# Patient Record
Sex: Female | Born: 1965 | Race: White | Hispanic: No | Marital: Single | Smoking: Current some day smoker
Health system: Southern US, Community
[De-identification: ages and names within clinical notes are randomized; demographics above are authoritative.]

## PROBLEM LIST (undated history)

## (undated) DIAGNOSIS — K219 Gastro-esophageal reflux disease without esophagitis: Secondary | ICD-10-CM

## (undated) HISTORY — PX: TUBAL LIGATION: SHX77

## (undated) HISTORY — DX: Gastro-esophageal reflux disease without esophagitis: K21.9

---

## 2000-06-28 ENCOUNTER — Emergency Department (HOSPITAL_COMMUNITY): Admission: EM | Admit: 2000-06-28 | Discharge: 2000-06-29 | Payer: Self-pay | Admitting: Internal Medicine

## 2002-04-21 ENCOUNTER — Emergency Department (HOSPITAL_COMMUNITY): Admission: EM | Admit: 2002-04-21 | Discharge: 2002-04-21 | Payer: Self-pay

## 2002-04-23 ENCOUNTER — Emergency Department (HOSPITAL_COMMUNITY): Admission: EM | Admit: 2002-04-23 | Discharge: 2002-04-23 | Payer: Self-pay | Admitting: Emergency Medicine

## 2014-01-19 ENCOUNTER — Encounter (HOSPITAL_COMMUNITY): Payer: Self-pay | Admitting: *Deleted

## 2014-01-19 ENCOUNTER — Emergency Department (HOSPITAL_COMMUNITY)
Admission: EM | Admit: 2014-01-19 | Discharge: 2014-01-19 | Disposition: A | Discharge status: Home / Self Care | Payer: Self-pay | Attending: Emergency Medicine | Admitting: Emergency Medicine

## 2014-01-19 ENCOUNTER — Emergency Department (HOSPITAL_COMMUNITY): Payer: Self-pay

## 2014-01-19 DIAGNOSIS — Z79899 Other long term (current) drug therapy: Secondary | ICD-10-CM | POA: Insufficient documentation

## 2014-01-19 DIAGNOSIS — Z720 Tobacco use: Secondary | ICD-10-CM | POA: Insufficient documentation

## 2014-01-19 DIAGNOSIS — Z3202 Encounter for pregnancy test, result negative: Secondary | ICD-10-CM | POA: Insufficient documentation

## 2014-01-19 DIAGNOSIS — M549 Dorsalgia, unspecified: Secondary | ICD-10-CM

## 2014-01-19 DIAGNOSIS — M5442 Lumbago with sciatica, left side: Secondary | ICD-10-CM | POA: Insufficient documentation

## 2014-01-19 LAB — URINALYSIS, ROUTINE W REFLEX MICROSCOPIC
Bilirubin Urine: NEGATIVE
GLUCOSE, UA: NEGATIVE mg/dL
Hgb urine dipstick: NEGATIVE
Ketones, ur: NEGATIVE mg/dL
LEUKOCYTES UA: NEGATIVE
Nitrite: NEGATIVE
Protein, ur: NEGATIVE mg/dL
Specific Gravity, Urine: 1.012 (ref 1.005–1.030)
Urobilinogen, UA: 0.2 mg/dL (ref 0.0–1.0)
pH: 6 (ref 5.0–8.0)

## 2014-01-19 LAB — POC URINE PREG, ED: PREG TEST UR: NEGATIVE

## 2014-01-19 MED ORDER — OXYCODONE-ACETAMINOPHEN 5-325 MG PO TABS
1.0000 | ORAL_TABLET | Freq: Once | ORAL | Status: AC
  Administered 2014-01-19: 1 via ORAL
  Filled 2014-01-19: qty 1

## 2014-01-19 MED ORDER — OXYCODONE-ACETAMINOPHEN 5-325 MG PO TABS: 1.0000 | ORAL_TABLET | Freq: Three times a day (TID) | ORAL | Status: DC | PRN

## 2014-01-19 MED ORDER — CYCLOBENZAPRINE HCL 10 MG PO TABS: 5.0000 mg | ORAL_TABLET | Freq: Two times a day (BID) | ORAL | Status: DC | PRN

## 2014-01-19 MED ORDER — PREDNISONE 20 MG PO TABS
60.0000 mg | ORAL_TABLET | Freq: Once | ORAL | Status: AC
  Administered 2014-01-19: 60 mg via ORAL
  Filled 2014-01-19: qty 3

## 2014-01-19 MED ORDER — PREDNISONE 20 MG PO TABS: ORAL_TABLET | ORAL | Status: DC

## 2014-01-19 NOTE — Discharge Instructions (Signed)
Please call your doctor for a followup appointment within 24-48 hours. When you talk to your doctor please let them know that you were seen in the emergency department and have them acquire all of your records so that they can discuss the findings with you and formulate a treatment plan to fully care for your new and ongoing problems. Please call and set-up an appointment with orthopedics to be seen and reassessed Please take medications as prescribed and on a full stomach Please apply heat and massage data muscular relief Please perform stretching Please take medications as prescribed - while on pain medications there is to be no drinking alcohol, driving, operating any heavy machinery. If extra please dispose in a proper manner. Please do not take any extra Tylenol with this medication for this can lead to Tylenol overdose and liver issues.  Please continue to monitor symptoms closely and if symptoms are to worsen or change (fever greater than 101, chills, sweating, nausea, vomiting, chest pain, shortness of breathe, difficulty breathing, weakness, numbness, tingling, worsening or changes to pain pattern, Fall, injury, loss of sensation, inability to control urine or bowel movements) please report back to the Emergency Department immediately.    Back Exercises Back exercises help treat and prevent back injuries. The goal of back exercises is to increase the strength of your abdominal and back muscles and the flexibility of your back. These exercises should be started when you no longer have back pain. Back exercises include:  Pelvic Tilt. Lie on your back with your knees bent. Tilt your pelvis until the lower part of your back is against the floor. Hold this position 5 to 10 sec and repeat 5 to 10 times.  Knee to Chest. Pull first 1 knee up against your chest and hold for 20 to 30 seconds, repeat this with the other knee, and then both knees. This may be done with the other leg straight or bent,  whichever feels better.  Sit-Ups or Curl-Ups. Bend your knees 90 degrees. Start with tilting your pelvis, and do a partial, slow sit-up, lifting your trunk only 30 to 45 degrees off the floor. Take at least 2 to 3 seconds for each sit-up. Do not do sit-ups with your knees out straight. If partial sit-ups are difficult, simply do the above but with only tightening your abdominal muscles and holding it as directed.  Hip-Lift. Lie on your back with your knees flexed 90 degrees. Push down with your feet and shoulders as you raise your hips a couple inches off the floor; hold for 10 seconds, repeat 5 to 10 times.  Back arches. Lie on your stomach, propping yourself up on bent elbows. Slowly press on your hands, causing an arch in your low back. Repeat 3 to 5 times. Any initial stiffness and discomfort should lessen with repetition over time.  Shoulder-Lifts. Lie face down with arms beside your body. Keep hips and torso pressed to floor as you slowly lift your head and shoulders off the floor. Do not overdo your exercises, especially in the beginning. Exercises may cause you some mild back discomfort which lasts for a few minutes; however, if the pain is more severe, or lasts for more than 15 minutes, do not continue exercises until you see your caregiver. Improvement with exercise therapy for back problems is slow.  See your caregivers for assistance with developing a proper back exercise program. Document Released: 04/10/2004 Document Revised: 05/26/2011 Document Reviewed: 01/02/2011 [facility] Patient Information 2015 [place], [place]. This information is  not intended to replace advice given to you by your health care provider. Make sure you discuss any questions you have with your health care provider. Sciatica Sciatica is pain, weakness, numbness, or tingling along the path of the sciatic nerve. The nerve starts in the lower back and runs down the back of each leg. The nerve controls the muscles in the  lower leg and in the back of the knee, while also providing sensation to the back of the thigh, lower leg, and the sole of your foot. Sciatica is a symptom of another medical condition. For instance, nerve damage or certain conditions, such as a herniated disk or bone spur on the spine, pinch or put pressure on the sciatic nerve. This causes the pain, weakness, or other sensations normally associated with sciatica. Generally, sciatica only affects one side of the body. CAUSES   Herniated or slipped disc.  Degenerative disk disease.  A pain disorder involving the narrow muscle in the buttocks (piriformis syndrome).  Pelvic injury or fracture.  Pregnancy.  Tumor (rare). SYMPTOMS  Symptoms can vary from mild to very severe. The symptoms usually travel from the low back to the buttocks and down the back of the leg. Symptoms can include:  Mild tingling or dull aches in the lower back, leg, or hip.  Numbness in the back of the calf or sole of the foot.  Burning sensations in the lower back, leg, or hip.  Sharp pains in the lower back, leg, or hip.  Leg weakness.  Severe back pain inhibiting movement. These symptoms may get worse with coughing, sneezing, laughing, or prolonged sitting or standing. Also, being overweight may worsen symptoms. DIAGNOSIS  Your caregiver will perform a physical exam to look for common symptoms of sciatica. He or she may ask you to do certain movements or activities that would trigger sciatic nerve pain. Other tests may be performed to find the cause of the sciatica. These may include:  Blood tests.  X-rays.  Imaging tests, such as an MRI or CT scan. TREATMENT  Treatment is directed at the cause of the sciatic pain. Sometimes, treatment is not necessary and the pain and discomfort goes away on its own. If treatment is needed, your caregiver may suggest:  Over-the-counter medicines to relieve pain.  Prescription medicines, such as anti-inflammatory  medicine, muscle relaxants, or narcotics.  Applying heat or ice to the painful area.  Steroid injections to lessen pain, irritation, and inflammation around the nerve.  Reducing activity during periods of pain.  Exercising and stretching to strengthen your abdomen and improve flexibility of your spine. Your caregiver may suggest losing weight if the extra weight makes the back pain worse.  Physical therapy.  Surgery to eliminate what is pressing or pinching the nerve, such as a bone spur or part of a herniated disk. HOME CARE INSTRUCTIONS   Only take over-the-counter or prescription medicines for pain or discomfort as directed by your caregiver.  Apply ice to the affected area for 20 minutes, 3-4 times a day for the first 48-72 hours. Then try heat in the same way.  Exercise, stretch, or perform your usual activities if these do not aggravate your pain.  Attend physical therapy sessions as directed by your caregiver.  Keep all follow-up appointments as directed by your caregiver.  Do not wear high heels or shoes that do not provide proper support.  Check your mattress to see if it is too soft. A firm mattress may lessen your pain and discomfort.  SEEK IMMEDIATE MEDICAL CARE IF:   You lose control of your bowel or bladder (incontinence).  You have increasing weakness in the lower back, pelvis, buttocks, or legs.  You have redness or swelling of your back.  You have a burning sensation when you urinate.  You have pain that gets worse when you lie down or awakens you at night.  Your pain is worse than you have experienced in the past.  Your pain is lasting longer than 4 weeks.  You are suddenly losing weight without reason. MAKE SURE YOU:  Understand these instructions.  Will watch your condition.  Will get help right away if you are not doing well or get worse. Document Released: 02/25/2001 Document Revised: 09/02/2011 Document Reviewed: 07/13/2011 [facility] Patient  Information 2015 [place], [place]. This information is not intended to replace advice given to you by your health care provider. Make sure you discuss any questions you have with your health care provider.   Emergency Department Resource Guide 1) Find a Doctor and Pay Out of Pocket Although you won't have to find out who is covered by your insurance plan, it is a good idea to ask around and get recommendations. You will then need to call the office and see if the doctor you have chosen will accept you as a new patient and what types of options they offer for patients who are self-pay. Some doctors offer discounts or will set up payment plans for their patients who do not have insurance, but you will need to ask so you aren't surprised when you get to your appointment.  2) Contact Your Local Health Department Not all health departments have doctors that can see patients for sick visits, but many do, so it is worth a call to see if yours does. If you don't know where your local health department is, you can check in your phone book. The CDC also has a tool to help you locate your state's health department, and many state websites also have listings of all of their local health departments.  3) Find a Walk-in Clinic If your illness is not likely to be very severe or complicated, you may want to try a walk in clinic. These are popping up all over the country in pharmacies, drugstores, and shopping centers. They're usually staffed by nurse practitioners or physician assistants that have been trained to treat common illnesses and complaints. They're usually fairly quick and inexpensive. However, if you have serious medical issues or chronic medical problems, these are probably not your best option.  No Primary Care Doctor: - Call Health Connect at  [phone] - they can help you locate a primary care doctor that  accepts your insurance, provides certain services, etc. - Physician Referral Service-  [phone]  Chronic Pain Problems: Organization         Address  Phone   Notes  [facility]  [phone] Patients need to be referred by their primary care doctor.   Medication Assistance: Organization         Address  Phone   Notes  [facility] Medication [facility] [address] [place], [place] [address] [phone] --Must be a resident of [facility] -- Must have NO insurance coverage whatsoever (no Medicaid/ Medicare, etc.) -- The pt. MUST have a primary care doctor that directs their care regularly and follows them in the community   [facility]ist  [phone]   [address]  [phone]  Agencies that provide inexpensive medical care: Organization         Address  Phone   Notes  [facility]  [phone]   [facility]    [phone]   [facility] [address] [place], [place] [address] [phone]   [facility] [address] [place], [place] [phone]   Planned Parenthood    [phone]   [facility]    [phone]   [facility]  [address], [place] Phone:  [phone], Fax:  [phone] Hours of Operation:  9 am - 6 pm, M-F.  Also accepts Medicaid/Medicare and self-pay.  [facility]  [address], [place] Phone: [phone], Fax: [phone]. Hours of Operation:  8:30 am - 5:30 pm, M-F.  Also accepts Medicaid and self-pay.  [facility] [place] [address] [place] Phone: [phone]   [facility] [address] Natasha Bence [place], [place] [phone], Ext. [phone] Mondays & Thursdays: 7-9 AM.  First 15 patients are seen on a first come, first serve basis.    Medicaid-accepting [facility] Providers:  Organization         Address  Phone   Notes  [facility] [address],  [place] [phone] Also accepts self-pay patients.  [facility] [address] Laurell Josephs [place], [place]  [phone]   [facility] [address], [place] [phone]   [facility] [address], [place] [phone]   Renaye Rakers [address], [place]   [phone] Only accepts [place] Access [place] patients after they have their name applied to their card.   Self-Pay (no insurance) in [facility]:  Organization         Address  Phone   Notes  Sickle Cell Patients, [facility] [address] [place], [place] [phone]   [facility] [address] [place], [place] [phone]   [facility] [place]  [address], [place] [phone]   [facility]/Dr. Osei-Bonsu  [address], [place] or [address], [place] [phone] Phone number for both [place] and [place] locations is the same.  [facility] [address] [place] [phone]   [facility] [address], [place] or [address] [phone]   [facility] [address] [place] [phone], phone; [phone], fax Sees patients 1st and 3rd Saturday of every month.  Must not qualify for public or private insurance (i.e. Medicaid, Medicare, [place] Health Choice, Veterans' Benefits)  Household income should be no more than 200% of the poverty level The clinic cannot treat you if you are pregnant or think you are pregnant  Sexually transmitted diseases are not treated at the clinic.    Dental Care: Organization         Address  Phone  Notes  [facility] Department [facility] [address] [place], [place] [phone] Accepts children up to age 60 who are enrolled in  [place] or [place] Health Choice; pregnant women with a Medicaid card; and children who have applied for Medicaid or  [place] Health Choice, but were declined, whose parents can pay a reduced fee at time of service.  [facility] [place]  [address], [place] [phone] Accepts children up to age 49 who are enrolled in [place] or [place] Health Choice; pregnant women with a Medicaid card; and children who have applied for Medicaid or [place] Health Choice, but were declined, whose parents can pay a reduced fee at time of service.  [facility]  [address], [place] [phone] Patients are seen by appointment only. Walk-ins are not accepted. [facility] will see patients 49 years of age and older. Monday - Tuesday (8am-5pm) Most Wednesdays (8:30-5pm) $30 per visit, cash only  [facility]  [address], [facility] [place] [phone] Patients are seen by appointment only. Walk-ins are not accepted. [facility] will see patients 49 years of age and older. One Wednesday Evening (Monthly: Volunteer Based).  $30 per visit, cash only  [facility]  [phone]) [phone] for adults; Children under age 634, call Graduate Pediatric Dentistry at [phone]) [phone]. Children aged 334-14, please call [phone] to request a pediatric application.  Dental services are provided in all areas of dental care including fillings, crowns and bridges, complete and partial dentures, implants, gum treatment, root canals, and extractions. Preventive care is also provided. Treatment is provided to both adults and children. Patients are selected via a lottery and there is often a waiting list.   [facility] [address], [place]  [phone] [email or web address]   [facility] [address], [place], [place] [phone], Ext. [phone] Second and Fourth Thursday of each month, opens at 6:30  AM; Clinic ends at 9 AM.  Patients are seen on a first-come first-served basis, and a limited number are seen during each clinic.   [facility]  [address], [place], [place] [phone]   Eligibility Requirements You must have lived in [place], [place], or [place] counties for at least the last three months.   You cannot be eligible for state or federal sponsored [place] Cityhealthcare insurance, including CIGNAVeterans Administration, [place]icaid, or [employer] EntertainmentMedicare.   You generally cannot be eligible for healthcare insurance through your employer.    How to apply: Eligibility screenings are held every Tuesday and Wednesday afternoon from 1:00 pm until 4:00 pm. You do not need an appointment for the interview!  [facility] [address], [place], [place] [phone]   [address]  [phone]   [facility]  [phone]   [facility]  [phone]    Behavioral Health Resources in the Community: Intensive Outpatient Programs Organization         Address  Phone  Notes  [facility] [address], [place], [place] [phone]   [facility] [address], [place], [place] [phone]   [facility] [address], [place], [place]  [phone]   [facility] [address],  [place], [place] [phone] or [phone]   Substance Abuse Resources Organization         Address  Phone  Notes  [facility]  [phone]   [facility]  [phone]   [facility]  [phone]   [facility]  [phone]   Residential & Outpatient Substance Abuse Program  [phone]  Psychological Services Organization         Address  Phone  Notes  [facility] Health  [phone]   [facility] Services  [phone]   [facility] [phone] [place] [phone] or  [phone]    Mobile Crisis Teams Organization         Address  Phone  Notes  [facility], Mobile Crisis Care Unit  [phone]   [facility]  [address] [place], [place] [phone]   Doristine Locks [address] [place] [phone]    Self-Help/Support Groups Organization         Address  Phone             Notes  [facility] [place] - variety of support groups  [phone]- [phone] Call for more information  Narcotics Anonymous (NA), [facility] [address], [employer] [place]  2 meetings at this location   Statistician         Address  Phone  Notes  [facility] [address],    [place]  [phone]   [facility]  [address] [place] [address], [place], [place] [phone]   [facility] [address] [place], [place] [phone] Admissions: 8am-3pm M-F  [facility] [address],    [place], [place] [phone]   [facility] [address] [place], [place], [place] [phone]   [facility] [address],  [place], [place] [phone]   [facility] - Intensive Outpatient [address]ure[address]s [address], [place], [place] [phone]   [facility] [address]  [place], [place] [phone] or [phone]   [facility] ([facility]) [address], [place], [place] [phone] Accepts Medicaid  [facility] [address]  [place] [phone] Substance Abuse/Addiction Treatment   [facility] Organization         Address  Phone  Notes  [facility]  [phone]   Angie Fava, PhD [address] Ervin Knack [place], [place]   [phone] or [phone]   [facility]   [address] [place], [place] [phone]   [facility] [address]  [place], [place] [phone] Insurance/Medicaid/sponsorship through [facility] [address]                                    [place], [place] [phone] Therapy/tele-psych/case  [facility] [address], [place] [phone]    Dr. Lolly Mustache  [phone]   [facility] [place]  [employer] [facility] Dept. 1) [address] [place] 2) [address], [place] 3)  [address], [place] [phone]  [phone]   [facility] Child Abuse Hotline [phone] or [phone] (After Hours)

## 2014-01-19 NOTE — ED Provider Notes (Signed)
CSN: [id number]     Arrival date & time 01/19/14  1706 History  This chart was scribed for non-physician practitioner working with Flint MelterElliott L Wentz, MD by Elveria Risingimelie Horne, ED Scribe. This patient was seen in room TR08C/TR08C and the patient's care was started at 6:53 PM.   Chief Complaint  Patient presents with  . Back Pain   The history is provided by the patient. No language interpreter was used.   HPI Comments: Jaime Rich is a 48 y.o. female With past medical history of back pain who presents to the Emergency Department complaining of constant sharp lower back pain with posterior radiation into her left leg extending into her calf onset last night. Patient reports burning sensation in her thigh and tingling pain in the left foot. Patient reports onset of stabbing pain with lifting the leg and exacerbation of pain with moving and standing after prolonged periods of sitting. Patient has been treating her pain with Advil, with no relief. Patient shares remote history of lower back pain many years; she denies recent complications, trauma, or injury. Patient denies bladder/bowel incontinence, urinary issues, numbness, tingling, dysuria, hematuria, hematochezia, melena. PCP none   History reviewed. No pertinent past medical history. History reviewed. No pertinent past surgical history. No family history on file. History  Substance Use Topics  . Smoking status: Current Some Day Smoker  . Smokeless tobacco: Not on file  . Alcohol Use: Yes   OB History    No data available     Review of Systems  Constitutional: Negative for fever and chills.  Gastrointestinal: Negative for vomiting.  Genitourinary: Negative for dysuria and hematuria.  Musculoskeletal: Positive for back pain and arthralgias.  Skin: Negative for rash.  Neurological: Negative for weakness and numbness.   Allergies  Tetracyclines & related and Indomethacin  Home Medications   Prior to Admission medications   Medication  Sig Start Date End Date Taking? Authorizing Provider  ibuprofen (ADVIL) 200 MG tablet Take 200 mg by mouth every 6 (six) hours as needed (pain).   Yes Historical Provider, MD  traMADol (ULTRAM) 50 MG tablet Take 50 mg by mouth once.   Yes Historical Provider, MD  cyclobenzaprine (FLEXERIL) 10 MG tablet Take 0.5 tablets (5 mg total) by mouth 2 (two) times daily as needed for muscle spasms. 01/19/14   Terrica Duecker, PA-C  oxyCODONE-acetaminophen (PERCOCET/ROXICET) 5-325 MG per tablet Take 1 tablet by mouth every 8 (eight) hours as needed for moderate pain or severe pain. 01/19/14   Isayah Ignasiak, PA-C  predniSONE (DELTASONE) 20 MG tablet 3 tabs po day one, then 2 tabs daily x 4 days 01/19/14   Yadier Bramhall, PA-C   Triage Vitals: BP 141/86 mmHg  Pulse 86  Temp(Src) 98.1 F (36.7 C) (Oral)  Resp 18  Ht 5\' 9"  (1.753 m)  Wt 121 lb (54.885 kg)  BMI 17.86 kg/m2  SpO2 97%  Physical Exam  Constitutional: She is oriented to person, place, and time. She appears well-developed and well-nourished. No distress.  HENT:  Head: Normocephalic and atraumatic.  Eyes: Conjunctivae and EOM are normal. Pupils are equal, round, and reactive to light. Right eye exhibits no discharge. Left eye exhibits no discharge.  Neck: Normal range of motion. Neck supple. No tracheal deviation present.  Cardiovascular: Normal rate, regular rhythm and normal heart sounds.   Pulses:      Radial pulses are 2+ on the right side, and 2+ on the left side.       Dorsalis pedis pulses  are 2+ on the right side, and 2+ on the left side.  Pulmonary/Chest: Effort normal and breath sounds normal. No respiratory distress. She has no wheezes. She has no rales.  Musculoskeletal: Normal range of motion. She exhibits tenderness.       Lumbar back: She exhibits tenderness. She exhibits normal range of motion, no bony tenderness, no swelling, no edema, no deformity and no laceration.       Back:  Negative deformities identified to the  spine. Negative swelling. Discomfort upon palpation to the lumbosacral spine and left paravertebral regions. Comfort upon palpation to the left buttock and posterior aspect of the left leg ranging down to the knee. Full ROM to upper and lower extremities without difficulty noted, negative ataxia noted.  Lymphadenopathy:    She has no cervical adenopathy.  Neurological: She is alert and oriented to person, place, and time. No cranial nerve deficit. She exhibits normal muscle tone. Coordination normal.  Cranial nerves III-XII grossly intact Strength 5+/5+ to upper and lower extremities bilaterally with resistance applied, equal distribution noted Equal grips bilaterally Negative saddle paresthesias bilaterally Sensation intact to differentiation to sharp and dull touch Gait proper, proper balance - negative sway, negative drift, negative step-offs  Skin: Skin is warm and dry.  Psychiatric: She has a normal mood and affect. Her behavior is normal.  Nursing note and vitals reviewed.   ED Course  Procedures (including critical care time)  COORDINATION OF CARE: 7:01 PM- Will refer to orthopedist and prescribe pain medication. Discussed treatment plan with patient at bedside and patient agreed to plan.   Results for orders placed or performed during the hospital encounter of 01/19/14  Urinalysis, Routine w reflex microscopic  Result Value Ref Range   Color, Urine YELLOW YELLOW   APPearance CLEAR CLEAR   Specific Gravity, Urine 1.012 1.005 - 1.030   pH 6.0 5.0 - 8.0   Glucose, UA NEGATIVE NEGATIVE mg/dL   Hgb urine dipstick NEGATIVE NEGATIVE   Bilirubin Urine NEGATIVE NEGATIVE   Ketones, ur NEGATIVE NEGATIVE mg/dL   Protein, ur NEGATIVE NEGATIVE mg/dL   Urobilinogen, UA 0.2 0.0 - 1.0 mg/dL   Nitrite NEGATIVE NEGATIVE   Leukocytes, UA NEGATIVE NEGATIVE  POC urine preg, ED (not at [facility])  Result Value Ref Range   Preg Test, Ur NEGATIVE NEGATIVE    Labs Review Labs Reviewed   URINALYSIS, ROUTINE W REFLEX MICROSCOPIC  POC URINE PREG, ED    Imaging Review Dg Lumbar Spine Complete  01/19/2014   CLINICAL DATA:  Back pain M54.9 (ICD-10-CM)Lower back pain since Sunday; pain radiating down left leg. advil not helping. No Known injury.  EXAM: LUMBAR SPINE - COMPLETE 4+ VIEW  COMPARISON:  None.  FINDINGS: Sacroiliac joints are symmetric. Maintenance of vertebral body height and alignment. Loss of intervertebral disc height at the lumbosacral junction and less so L4-5 level. Facet arthropathy at L5-S1.  IMPRESSION: Spondylosis/degenerative disc disease. No acute osseous abnormality.   Electronically Signed   By: Jeronimo GreavesKyle  Talbot M.D.   On: 01/19/2014 19:01     EKG Interpretation None      MDM   Final diagnoses:  Back pain  Left-sided low back pain with left-sided sciatica    Medications  oxyCODONE-acetaminophen (PERCOCET/ROXICET) 5-325 MG per tablet 1 tablet (1 tablet Oral Given 01/19/14 1914)  predniSONE (DELTASONE) tablet 60 mg (60 mg Oral Given 01/19/14 1913)   Filed Vitals:   01/19/14 1713 01/19/14 1928 01/19/14 2000  BP: 141/86 136/74 131/93  Pulse: 86 79 84  Temp: 98.1 F (36.7 C) 98.7 F (37.1 C) 98.6 F (37 C)  TempSrc: Oral Oral Oral  Resp: 18 16 18   Height: 5\' 9"  (1.753 m)    Weight: 121 lb (54.885 kg)    SpO2: 97% 100% 100%   I personally performed the services described in this documentation, which was scribed in my presence. The recorded information has been reviewed and is accurate.  Urine pregnancy negative. Urinalysis unremarkable-negative findings of infection. Plain film negative for cure. Doubt UTI. Doubt polynephritis. Doubt cauda equina. Doubt epidural abscess. Suspicions for sciatic pain secondary to pain upon palpation to the posterior aspect of the left leg and left buttock. Negative focal neurological deficits noted. Equal grip strength bilaterally. Sensation intact. Gait proper with negative step-offs or sway-patient able to walk in  high heels. Patient stable, afebrile. Patient not septic appearing. Discharged patient. Discharge patient with pain medications. Discussed with patient to avoid any physical strenuous activity. Discussed with patient to massage performed back exercises. Referred to orthopedics and health and wellness center-resource guide given. Discussed with patient to closely monitor symptoms and if symptoms are to worsen or change to report back to the ED - strict return instructions given.  Patient agreed to plan of care, understood, all questions answered.   Raymon Mutton, PA-C 01/19/14 2015  Flint Melter, MD 01/19/14 [phone]

## 2014-01-19 NOTE — ED Notes (Signed)
Lower back pain since Sunday; pain radiating down left leg. advil not helping.

## 2017-10-12 ENCOUNTER — Encounter (HOSPITAL_COMMUNITY): Payer: Self-pay

## 2017-10-12 ENCOUNTER — Other Ambulatory Visit: Payer: Self-pay

## 2017-10-12 ENCOUNTER — Emergency Department (HOSPITAL_COMMUNITY)
Admission: EM | Admit: 2017-10-12 | Discharge: 2017-10-12 | Disposition: A | Discharge status: Home / Self Care | Payer: Self-pay | Attending: Emergency Medicine | Admitting: Emergency Medicine

## 2017-10-12 ENCOUNTER — Emergency Department (HOSPITAL_COMMUNITY): Payer: Self-pay

## 2017-10-12 DIAGNOSIS — F17200 Nicotine dependence, unspecified, uncomplicated: Secondary | ICD-10-CM | POA: Insufficient documentation

## 2017-10-12 DIAGNOSIS — R072 Precordial pain: Secondary | ICD-10-CM | POA: Insufficient documentation

## 2017-10-12 LAB — CBC WITH DIFFERENTIAL/PLATELET
BASOS PCT: 0 %
Basophils Absolute: 0 10*3/uL (ref 0.0–0.1)
EOS ABS: 0 10*3/uL (ref 0.0–0.7)
EOS PCT: 0 %
HCT: 40.4 % (ref 36.0–46.0)
Hemoglobin: 14 g/dL (ref 12.0–15.0)
LYMPHS ABS: 2 10*3/uL (ref 0.7–4.0)
Lymphocytes Relative: 28 %
MCH: 33.9 pg (ref 26.0–34.0)
MCHC: 34.7 g/dL (ref 30.0–36.0)
MCV: 97.8 fL (ref 78.0–100.0)
MONO ABS: 0.5 10*3/uL (ref 0.1–1.0)
MONOS PCT: 6 %
Neutro Abs: 4.7 10*3/uL (ref 1.7–7.7)
Neutrophils Relative %: 66 %
PLATELETS: 220 10*3/uL (ref 150–400)
RBC: 4.13 MIL/uL (ref 3.87–5.11)
RDW: 13.1 % (ref 11.5–15.5)
WBC: 7.1 10*3/uL (ref 4.0–10.5)

## 2017-10-12 LAB — COMPREHENSIVE METABOLIC PANEL
ALT: 20 U/L (ref 0–44)
AST: 29 U/L (ref 15–41)
Albumin: 4.2 g/dL (ref 3.5–5.0)
Alkaline Phosphatase: 63 U/L (ref 38–126)
Anion gap: 10 (ref 5–15)
BUN: 14 mg/dL (ref 6–20)
CALCIUM: 9.2 mg/dL (ref 8.9–10.3)
CHLORIDE: 106 mmol/L (ref 98–111)
CO2: 27 mmol/L (ref 22–32)
CREATININE: 0.75 mg/dL (ref 0.44–1.00)
GFR calc Af Amer: >60 mL/min (ref >60)
Glucose, Bld: 99 mg/dL (ref 70–99)
POTASSIUM: 3.5 mmol/L (ref 3.5–5.1)
Sodium: 143 mmol/L (ref 135–145)
Total Bilirubin: 0.5 mg/dL (ref 0.3–1.2)
Total Protein: 7.4 g/dL (ref 6.5–8.1)

## 2017-10-12 LAB — I-STAT TROPONIN, ED: TROPONIN I, POC: 0 ng/mL (ref 0.00–0.08)

## 2017-10-12 MED ORDER — SUCRALFATE 1 G PO TABS
1.0000 g | ORAL_TABLET | Freq: Once | ORAL | Status: AC
  Administered 2017-10-12: 1 g via ORAL
  Filled 2017-10-12: qty 1

## 2017-10-12 MED ORDER — PANTOPRAZOLE SODIUM 20 MG PO TBEC: 20.0000 mg | DELAYED_RELEASE_TABLET | Freq: Every day | ORAL | 0 refills | Status: DC

## 2017-10-12 MED ORDER — SUCRALFATE 1 GM/10ML PO SUSP: 1.0000 g | Freq: Three times a day (TID) | ORAL | 0 refills | Status: DC

## 2017-10-12 MED ORDER — GI COCKTAIL ~~LOC~~
30.0000 mL | Freq: Once | ORAL | Status: AC
  Administered 2017-10-12: 30 mL via ORAL
  Filled 2017-10-12: qty 30

## 2017-10-12 NOTE — ED Provider Notes (Signed)
Emergency Department Provider Note   I have reviewed the triage vital signs and the nursing notes.   HISTORY  Chief Complaint Chest Pain   HPI Jaime Rich is a 52 y.o. female with PMH of chest pain and suspected GERD presents to the emergency department for evaluation of substernal chest discomfort radiating to the throat for the past several months.  Patient describes constant pain symptoms that seem to be worsening.  She occasionally will have discomfort on the top of the left shoulder and in the left armpit as well.  She states that pain is constant but will temporarily improve with eating and then worsened afterwards.  Denies any abdominal discomfort.  No vomiting or diarrhea.  She was referred to gastroenterology for an upper endoscopy but did not have insurance at the time so could not complete the test.  She anticipates getting insurance in September and was trying to wait but states that the symptoms have persistently worsened.  Denies any pleuritic or exertional pain.  No heart palpitations or shortness of breath.    History reviewed. No pertinent past medical history.  There are no active problems to display for this patient.   History reviewed. No pertinent surgical history.  Allergies Tetracyclines & related and Indomethacin  History reviewed. No pertinent family history.  Social History Social History   Tobacco Use  . Smoking status: Current Some Day Smoker  . Smokeless tobacco: Never Used  Substance Use Topics  . Alcohol use: Yes    Comment: 2 glasses of wine daily   . Drug use: Never    Review of Systems  Constitutional: No fever/chills Eyes: No visual changes. ENT: No sore throat. Cardiovascular: Positive chest pain. Respiratory: Denies shortness of breath. Gastrointestinal: No abdominal pain.  No nausea, no vomiting.  No diarrhea.  No constipation. Genitourinary: Negative for dysuria. Musculoskeletal: Negative for back pain. Skin: Negative for  rash. Neurological: Negative for headaches, focal weakness or numbness.  10-point ROS otherwise negative.  ____________________________________________   PHYSICAL EXAM:  VITAL SIGNS: ED Triage Vitals  Enc Vitals Group     BP 10/12/17 1039 (!) 134/91     Pulse Rate 10/12/17 1039 95     Resp 10/12/17 1039 17     Temp 10/12/17 1039 98.3 F (36.8 C)     Temp Source 10/12/17 1039 Oral     SpO2 10/12/17 1039 100 %     Weight 10/12/17 1047 131 lb (59.4 kg)     Pain Score 10/12/17 1047 9   Constitutional: Alert and oriented. Well appearing and in no acute distress. Eyes: Conjunctivae are normal.  Head: Atraumatic. Nose: No congestion/rhinnorhea. Mouth/Throat: Mucous membranes are moist.  Oropharynx non-erythematous. Neck: No stridor.  Cardiovascular: Normal rate, regular rhythm. Good peripheral circulation. Grossly normal heart sounds.   Respiratory: Normal respiratory effort.  No retractions. Lungs CTAB. Gastrointestinal: Soft with mild epigastric discomfort. No RUQ tenderness. No rebound or guarding. No distention.  Musculoskeletal: No lower extremity tenderness nor edema. No gross deformities of extremities. Neurologic:  Normal speech and language. No gross focal neurologic deficits are appreciated.  Skin:  Skin is warm, dry and intact. No rash noted.  ____________________________________________   LABS (all labs ordered are listed, but only abnormal results are displayed)  Labs Reviewed  COMPREHENSIVE METABOLIC PANEL  CBC WITH DIFFERENTIAL/PLATELET  I-STAT TROPONIN, ED   ____________________________________________  EKG   EKG Interpretation  Date/Time:  Monday October 12 2017 10:39:13 EDT Ventricular Rate:  96 PR Interval:  QRS Duration: 82 QT Interval:  353 QTC Calculation: 447 R Axis:   86 Text Interpretation:  Sinus rhythm Minimal ST depression, diffuse leads No old tracing for comparison. No STEMI.  Confirmed by Alona Bene [phone]) on 10/12/2017 10:44:38 AM        ____________________________________________  RADIOLOGY  Dg Chest 2 View  Result Date: 10/12/2017 CLINICAL DATA:  Onset of chest pain today. Several days of severe throat pain. Current smoker. EXAM: CHEST - 2 VIEW COMPARISON:  None in PACs FINDINGS: The lungs are well-expanded with mild hemidiaphragm flattening. There is no focal infiltrate. There is no pleural effusion. The heart and pulmonary vascularity are normal. The mediastinum is normal in width. The trachea is midline. The bony thorax exhibits no acute abnormality. IMPRESSION: There is no acute cardiopulmonary abnormality. Mild chronic bronchitic-smoking related changes. Electronically Signed   By: David  [place] M.D.   On: 10/12/2017 12:38    ____________________________________________   PROCEDURES  Procedure(s) performed:   Procedures  None ____________________________________________   INITIAL IMPRESSION / ASSESSMENT AND PLAN / ED COURSE  Pertinent labs & imaging results that were available during my care of the patient were reviewed by me and considered in my medical decision making (see chart for details).  Patient presents to the emergency department for evaluation of chest pain.  Symptoms have been constant for the past 5 months with intermittent worsening.  No interval of chest pain relief.  Symptoms seem very gastrointestinal in nature with slight improvement with eating followed by worsening shortly afterwards.  The patient does have several risk factors for acute coronary syndrome and an EKG with nonspecific ST changes.  Plan for chest x-ray, troponin, labs, GI cocktail.  With constant pain since March would expect elevated troponin if myocardial injury was present.  Troponin negative. CXR reviewed with no acute findings. Pain improved after GI cocktail. Plan for Carafate and Protonix at home with close PCP and GI follow up. With non-specific ST changes on EKG plan for Cardiology follow up as well but my  suspicion for ACS with 5 months of constant chest pain and normal troponin is exceedingly low.   At this time, I do not feel there is any life-threatening condition present. I have reviewed and discussed all results (EKG, imaging, lab, urine as appropriate), exam findings with patient. I have reviewed nursing notes and appropriate previous records.  I feel the patient is safe to be discharged home without further emergent workup. Discussed usual and customary return precautions. Patient and family (if present) verbalize understanding and are comfortable with this plan.  Patient will follow-up with their primary care provider. If they do not have a primary care provider, information for follow-up has been provided to them. All questions have been answered.  ____________________________________________  FINAL CLINICAL IMPRESSION(S) / ED DIAGNOSES  Final diagnoses:  Precordial chest pain     MEDICATIONS GIVEN DURING THIS VISIT:  Medications  gi cocktail (Maalox,Lidocaine,Donnatal) (30 mLs Oral Given 10/12/17 1111)  sucralfate (CARAFATE) tablet 1 g (1 g Oral Given 10/12/17 1407)     NEW OUTPATIENT MEDICATIONS STARTED DURING THIS VISIT:  Discharge Medication List as of 10/12/2017  1:31 PM    START taking these medications   Details  pantoprazole (PROTONIX) 20 MG tablet Take 1 tablet (20 mg total) by mouth daily., Starting Mon 10/12/2017, Until Wed 11/11/2017, Normal    sucralfate (CARAFATE) 1 GM/10ML suspension Take 10 mLs (1 g total) by mouth 4 (four) times daily -  with meals and at bedtime.,  Starting Mon 10/12/2017, Normal        Note:  This document was prepared using Dragon voice recognition software and may include unintentional dictation errors.  Alona Bene, MD Emergency Medicine    Babs Dabbs, Arlyss Repress, MD 10/12/17 1539

## 2017-10-12 NOTE — ED Triage Notes (Signed)
Pt arrives via POV from home. Pt reports chest pain that has been reoccurring since March. Pt reports that she was seen in March and MD was concerned for GERD but she was unable to followup. Pt reports substernal chest pain.

## 2017-10-12 NOTE — Discharge Instructions (Signed)

## 2017-11-26 ENCOUNTER — Encounter: Payer: Self-pay | Admitting: Cardiovascular Disease

## 2017-12-01 NOTE — Progress Notes (Deleted)
Cardiology Office Note   Date:  12/01/2017   ID:  Jaime Rich, DOB Aug 06, 1965, MRN [id number]  PCP:  Patient, No Pcp Per  Cardiologist:   Charlton Haws, MD   No chief complaint on file.     History of Present Illness: Jaime Rich is a 52 y.o. female who presents for consultation regarding chest pain and abnormal ECG  Referred by Dr Andee Lineman ED Reviewed notes from 10/12/17.  History of GERD. SSCP constant and progressive substernal radiating to throat. Has been going on for several months Some radiation to left shoulder and axilla Partial relief with food then worse She was supposed to see GI for EGD but had no insurance She smokes less than ppd. 2 glasses of wine daily R/O CXR with bronchitic changes from smoking no cardiomegaly Pain improved in ER with GI cocktail Sent home with protonix 20 mg daily and sucralfate 1 gram qid   No past medical history on file.  No past surgical history on file.   Current Outpatient Medications  Medication Sig Dispense Refill  . albuterol (PROVENTIL HFA;VENTOLIN HFA) 108 (90 Base) MCG/ACT inhaler Inhale 2 puffs into the lungs every 6 (six) hours as needed for wheezing or shortness of breath.    . dexlansoprazole (DEXILANT) 60 MG capsule Take 60 mg by mouth daily.    . pantoprazole (PROTONIX) 20 MG tablet Take 1 tablet (20 mg total) by mouth daily. 30 tablet 0  . sucralfate (CARAFATE) 1 GM/10ML suspension Take 10 mLs (1 g total) by mouth 4 (four) times daily -  with meals and at bedtime. 420 mL 0   No current facility-administered medications for this visit.     Allergies:   Tetracyclines & related and Indomethacin    Social History:  The patient  reports that she has been smoking. She has never used smokeless tobacco. She reports that she drinks alcohol. She reports that she does not use drugs.   Family History:  The patient's family history is not on file.    ROS:  Please see the history of present illness.   Otherwise, review of systems  are positive for none.   All other systems are reviewed and negative.    PHYSICAL EXAM: VS:  There were no vitals taken for this visit. , BMI There is no height or weight on file to calculate BMI. Affect appropriate Healthy:  appears stated age HEENT: normal Neck supple with no adenopathy JVP normal no bruits no thyromegaly Lungs clear with no wheezing and good diaphragmatic motion Heart:  S1/S2 no murmur, no rub, gallop or click PMI normal Abdomen: benighn, BS positve, no tenderness, no AAA no bruit.  No HSM or HJR Distal pulses intact with no bruits No edema Neuro non-focal Skin warm and dry No muscular weakness    EKG:  SR rate 96 nonspecific ST changes 10/13/17    Recent Labs: 10/12/2017: ALT 20; BUN 14; Creatinine, Ser 0.75; Hemoglobin 14.0; Platelets 220; Potassium 3.5; Sodium 143    Lipid Panel No results found for: CHOL, TRIG, HDL, CHOLHDL, VLDL, LDLCALC, LDLDIRECT    Wt Readings from Last 3 Encounters:  10/12/17 131 lb (59.4 kg)  01/19/14 121 lb (54.9 kg)      Other studies Reviewed: Additional studies/ records that were reviewed today include: Notes from ER 10/13/17 ECG labs and CXR .    ASSESSMENT AND PLAN:  1.  Chest Pain:  Atypical suspect gastritis. Needs to f/u with GI and get endoscopy. *** 2.  Smoking with bronchitic changes on CXR counseled on cessation for less than 10 minutes 3. ETOH:  And smoking will exacerbate GERD   Current medicines are reviewed at length with the patient today.  The patient does not have concerns regarding medicines.  The following changes have been made:  no change  Labs/ tests ordered today include: *** No orders of the defined types were placed in this encounter.    Disposition:   FU with cardiology PRN      Signed, Lavin Petteway, MD  12/01/2017 10:44 AM    [facility] [address], [place], [place]  [address] Phone: [phone]; Fax: [phone]

## 2017-12-09 ENCOUNTER — Ambulatory Visit: Payer: Self-pay | Admitting: Cardiovascular Disease

## 2018-01-06 ENCOUNTER — Encounter: Payer: Self-pay | Admitting: Cardiovascular Disease

## 2018-01-06 ENCOUNTER — Ambulatory Visit: Payer: Managed Care, Other (non HMO) | Admitting: Cardiovascular Disease

## 2018-01-06 VITALS — BP 132/86 | HR 63 | Ht 69.0 in | Wt 136.6 lb

## 2018-01-06 DIAGNOSIS — Z720 Tobacco use: Secondary | ICD-10-CM

## 2018-01-06 DIAGNOSIS — R072 Precordial pain: Secondary | ICD-10-CM | POA: Diagnosis not present

## 2018-01-06 LAB — BASIC METABOLIC PANEL
BUN/Creatinine Ratio: 18 (ref 9–23)
BUN: 12 mg/dL (ref 6–24)
CALCIUM: 9.6 mg/dL (ref 8.7–10.2)
CO2: 23 mmol/L (ref 20–29)
CREATININE: 0.68 mg/dL (ref 0.57–1.00)
Chloride: 102 mmol/L (ref 96–106)
GFR calc Af Amer: 115 mL/min/{1.73_m2} (ref >59)
GFR, EST NON AFRICAN AMERICAN: 100 mL/min/{1.73_m2} (ref >59)
GLUCOSE: 94 mg/dL (ref 65–99)
POTASSIUM: 4.2 mmol/L (ref 3.5–5.2)
SODIUM: 142 mmol/L (ref 134–144)

## 2018-01-06 MED ORDER — METOPROLOL TARTRATE 50 MG PO TABS: ORAL_TABLET | ORAL | 0 refills | Status: DC

## 2018-01-06 MED ORDER — VARENICLINE TARTRATE 0.5 MG X 11 & 1 MG X 42 PO MISC: ORAL | 0 refills | Status: DC

## 2018-01-06 MED ORDER — VARENICLINE TARTRATE 1 MG PO TABS: 1.0000 mg | ORAL_TABLET | Freq: Two times a day (BID) | ORAL | 0 refills | Status: DC

## 2018-01-06 NOTE — Addendum Note (Signed)
Addended by: Dossie Arbour on: 01/06/2018 09:42 AM   Modules accepted: Orders

## 2018-01-06 NOTE — Patient Instructions (Addendum)
Medication Instructions:  Your physician has recommended you make the following change in your medication: Start Chantix.  Follow label instructions.    If you need a refill on your cardiac medications before your next appointment, please call your pharmacy.   Lab work: Lab work to be done today--BMP If you have labs (blood work) drawn today and your tests are completely normal, you will receive your results only by: Marland Kitchen MyChart Message (if you have MyChart) OR . A paper copy in the mail If you have any lab test that is abnormal or we need to change your treatment, we will call you to review the results.  Testing/Procedures:   Your physician has requested that you have cardiac CT. Cardiac computed tomography (CT) is a painless test that uses an x-ray machine to take clear, detailed pictures of your heart. For further information please visit [email or web address] Please follow instruction sheet as given.    Follow-Up: Your physician recommends that you schedule a follow-up appointment in: 6-8 weeks. Scheduled for December 4,2019 at 10:40    Please arrive at the [facility] main entrance of [facility] at  AM (30-45 minutes prior to test start time)  [facility] [address] [place], [place] [address] [phone]  Proceed to the [facility] Radiology Department (First Floor).  Please follow these instructions carefully (unless otherwise directed):    On the Night Before the Test: . Be sure to Drink plenty of water. . Do not consume any caffeinated/decaffeinated beverages or chocolate 12 hours prior to your test. . Do not take any antihistamines 12 hours prior to your test. I On the Day of the Test: . Drink plenty of water. Do not drink any water within one hour of the test. . Do not eat any food 4 hours prior to the test. . You may take your regular medications prior to the test.  . Take metoprolol (Lopressor) two hours prior to test.  Dose is 50 mg.   Do not take if heart rate less than 55.        After the Test: . Drink plenty of water. . After receiving IV contrast, you may experience a mild flushed feeling. This is normal. . On occasion, you may experience a mild rash up to 24 hours after the test. This is not dangerous. If this occurs, you can take Benadryl 25 mg and increase your fluid intake. . If you experience trouble breathing, this can be serious. If it is severe call 911 IMMEDIATELY. If it is mild, please call our office. I

## 2018-01-06 NOTE — Progress Notes (Signed)
Chief Complaint  Jaime Rich presents with  . New Jaime Rich (Initial Visit)   History of Present Illness: 52 yo female with history of chest pain and GERD here today as a new Jaime Rich for evaluation of chest pain. She tells me that she had a severe episode of chest pain in July that radiated from her chest to neck. No exertional chest pain. The pain was not associated with meals. Most recent episode of chest pain while cleaning and climbing stairs. The pain lasted 2-3 minutes. She has chest pain that occurs several days per week, usually mild and while working. No associated dyspnea or diaphoresis. No prior cardiac issues. She has smoked 1 ppd for 35 years. Her mother had an MI in her 29s and her father had an MI in his 31s.   Primary Care Physician: Jaime Rich, No Pcp Per  Past Medical History:  Diagnosis Date  . GERD (gastroesophageal reflux disease)     Past Surgical History:  Procedure Laterality Date  . TUBAL LIGATION      Current Outpatient Medications  Medication Sig Dispense Refill  . albuterol (PROVENTIL HFA;VENTOLIN HFA) 108 (90 Base) MCG/ACT inhaler Inhale 2 puffs into the lungs every 6 (six) hours as needed for wheezing or shortness of breath.    . metoprolol tartrate (LOPRESSOR) 50 MG tablet Take one tablet by mouth 2 hours prior to CT scan 1 tablet 0  . varenicline (CHANTIX CONTINUING MONTH PAK) 1 MG tablet Take 1 tablet (1 mg total) by mouth 2 (two) times daily. 60 tablet 0  . varenicline (CHANTIX STARTING MONTH PAK) 0.5 MG X 11 & 1 MG X 42 tablet Take as directed 53 tablet 0   No current facility-administered medications for this visit.     Allergies  Allergen Reactions  . Tetracyclines & Related Anaphylaxis  . Indomethacin Other (See Comments)    hallucinations    Social History   Socioeconomic History  . Marital status: Single    Spouse name: Not on file  . Number of children: 2  . Years of education: Not on file  . Highest education level: Not on file    Occupational History  . Occupation: Audiological scientist  Social Needs  . Financial resource strain: Not on file  . Food insecurity:    Worry: Not on file    Inability: Not on file  . Transportation needs:    Medical: Not on file    Non-medical: Not on file  Tobacco Use  . Smoking status: Current Every Day Smoker    Packs/day: 1.00    Years: 35.00    Pack years: 35.00  . Smokeless tobacco: Never Used  Substance and Sexual Activity  . Alcohol use: Yes    Comment: 2 glasses of wine daily   . Drug use: Never  . Sexual activity: Not on file  Lifestyle  . Physical activity:    Days per week: Not on file    Minutes per session: Not on file  . Stress: Not on file  Relationships  . Social connections:    Talks on phone: Not on file    Gets together: Not on file    Attends religious service: Not on file    Active member of club or organization: Not on file    Attends meetings of clubs or organizations: Not on file    Relationship status: Not on file  . Intimate partner violence:    Fear of current or ex partner: Not on file  Emotionally abused: Not on file    Physically abused: Not on file    Forced sexual activity: Not on file  Other Topics Concern  . Not on file  Social History Narrative  . Not on file    Family History  Problem Relation Age of Onset  . CAD Mother        MI age 53  . CAD Father        MI age 68    Review of Systems:  As stated in the HPI and otherwise negative.   BP 132/86   Pulse 63   Ht 5\' 9"  (1.753 m)   Wt 136 lb 9.6 oz (62 kg)   SpO2 99%   BMI 20.17 kg/m   Physical Examination: General: Well developed, well nourished, NAD  HEENT: OP clear, mucus membranes moist  SKIN: warm, dry. No rashes. Neuro: No focal deficits  Musculoskeletal: Muscle strength 5/5 all ext  Psychiatric: Mood and affect normal  Neck: No JVD, no carotid bruits, no thyromegaly, no lymphadenopathy.  Lungs:Clear bilaterally, no wheezes, rhonci, crackles Cardiovascular:  Regular rate and rhythm. No murmurs, gallops or rubs. Abdomen:Soft. Bowel sounds present. Non-tender.  Extremities: No lower extremity edema. Pulses are 2 + in the bilateral DP/PT.  EKG:  EKG is ordered today. The ekg ordered today demonstrates NSR, rate 63 bpm  Recent Labs: 10/12/2017: ALT 20; BUN 14; Creatinine, Ser 0.75; Hemoglobin 14.0; Platelets 220; Potassium 3.5; Sodium 143   Lipid Panel No results found for: CHOL, TRIG, HDL, CHOLHDL, VLDL, LDLCALC, LDLDIRECT   Wt Readings from Last 3 Encounters:  01/06/18 136 lb 9.6 oz (62 kg)  10/12/17 131 lb (59.4 kg)  01/19/14 121 lb (54.9 kg)     Other studies Reviewed: Additional studies/ records that were reviewed today include:  Review of the above records demonstrates:    Assessment and Plan:   1. Chest pain; Risk factors for CAD include heavy tobacco abuse for 35 years and FH of premature CAD. Will arrange cardiac CTA to exclude CAD. Echo to exclude structural heart disease and assess LVEF. BMET today prior CTA.   2. Tobacco abuse: Will start Chantix.   Current medicines are reviewed at length with the Jaime Rich today.  The Jaime Rich does not have concerns regarding medicines.  The following changes have been made:  no change  Labs/ tests ordered today include:   Orders Placed This Encounter  Procedures  . CT CORONARY MORPH W/CTA COR W/SCORE W/CA W/CM &/OR WO/CM  . CT CORONARY FRACTIONAL FLOW RESERVE DATA PREP  . CT CORONARY FRACTIONAL FLOW RESERVE FLUID ANALYSIS  . Basic Metabolic Panel (BMET)  . EKG 12-Lead  . ECHOCARDIOGRAM COMPLETE     Disposition:   FU with me in 8 weeks   Signed, Verne Carrow, MD 01/06/2018 9:39 AM    [facility] [address] [place], [place], [place]  [address] Phone: [phone]; Fax: [phone]

## 2018-01-29 ENCOUNTER — Other Ambulatory Visit: Payer: Self-pay | Admitting: Cardiovascular Disease

## 2018-02-02 NOTE — Telephone Encounter (Signed)
Pharmacy is requesting a ninety day supply. Okay to change? Please advise. Thanks, MI

## 2018-02-03 NOTE — Telephone Encounter (Signed)
I spoke with pharmacist and this was an automated response. Pt has not picked up started pack yet due to cost.  Will refuse 90 day supply and continue with one month continuing pack.

## 2018-02-15 ENCOUNTER — Telehealth: Payer: Self-pay | Admitting: *Deleted

## 2018-02-15 NOTE — Telephone Encounter (Signed)
I tried again to reach pt but voicemail not set up.  I also tried work number but was unable to reach pt

## 2018-02-15 NOTE — Telephone Encounter (Signed)
Pre cert is still waiting for approval from insurance for cardiac CT.  Follow up appointment with Dr. Clifton JamesMcAlhany on 12/4 will need to be rescheduled.  I placed call to pt but voicemail has not been set up.

## 2018-02-17 ENCOUNTER — Ambulatory Visit: Payer: Managed Care, Other (non HMO) | Admitting: Cardiovascular Disease

## 2018-02-17 NOTE — Telephone Encounter (Signed)
Our scheduling department was able to reach Jaime Rich yesterday and cancel today's appointment. I spoke with Jaime Rich today and she is aware CT is scheduled for 03/03/18.  I scheduled her to see Dr. Clifton JamesMcAlhany on 03/25/18 at 11:00 for follow up.

## 2018-03-01 ENCOUNTER — Telehealth (HOSPITAL_COMMUNITY): Payer: Self-pay | Admitting: Emergency Medicine

## 2018-03-01 NOTE — Telephone Encounter (Signed)
RN NAV reaching out to patient to answer any questions about coronary CT on 03-03-18; pt verbalized understanding of metoprolol admin and when to arrive to radiology department; name and call back number provided for further questions Rockwell AlexandriaSara Jakaiya [place] [phone]

## 2018-03-03 ENCOUNTER — Ambulatory Visit (HOSPITAL_COMMUNITY)
Admission: RE | Admit: 2018-03-03 | Discharge: 2018-03-03 | Disposition: A | Discharge status: Home / Self Care | Payer: Managed Care, Other (non HMO) | Source: Ambulatory Visit | Attending: Cardiovascular Disease | Admitting: Cardiovascular Disease

## 2018-03-03 ENCOUNTER — Ambulatory Visit (HOSPITAL_COMMUNITY): Admission: RE | Admit: 2018-03-03 | Payer: Managed Care, Other (non HMO) | Source: Ambulatory Visit

## 2018-03-03 DIAGNOSIS — R072 Precordial pain: Secondary | ICD-10-CM | POA: Insufficient documentation

## 2018-03-03 LAB — POCT I-STAT CREATININE: CREATININE: 0.6 mg/dL (ref 0.44–1.00)

## 2018-03-03 MED ORDER — IOPAMIDOL (ISOVUE-370) INJECTION 76%
100.0000 mL | Freq: Once | INTRAVENOUS | Status: AC | PRN
  Administered 2018-03-03: 100 mL via INTRAVENOUS

## 2018-03-03 MED ORDER — NITROGLYCERIN 0.4 MG SL SUBL
SUBLINGUAL_TABLET | SUBLINGUAL | Status: AC
  Administered 2018-03-03: 0.8 mg via SUBLINGUAL
  Filled 2018-03-03: qty 2

## 2018-03-03 MED ORDER — NITROGLYCERIN 0.4 MG SL SUBL
0.8000 mg | SUBLINGUAL_TABLET | Freq: Once | SUBLINGUAL | Status: AC
  Administered 2018-03-03: 0.8 mg via SUBLINGUAL
  Filled 2018-03-03: qty 25

## 2018-03-05 ENCOUNTER — Ambulatory Visit (HOSPITAL_COMMUNITY): Payer: Managed Care, Other (non HMO)

## 2018-03-25 ENCOUNTER — Ambulatory Visit: Payer: Managed Care, Other (non HMO) | Admitting: Cardiovascular Disease

## 2018-03-25 NOTE — Progress Notes (Deleted)
No chief complaint on file.  History of Present Illness: 53 yo female with history of chest pain and GERD here today for cardiac follow up. I saw her as a new patient for evaluation of chest pain in October 2019. She described a severe episode of chest pain in July that radiated from her chest to neck. No exertional chest pain. The pain was not associated with meals. This was followed by chest pain while cleaning and climbing stairs. No associated dyspnea or diaphoresis. No prior cardiac issues. She has smoked 1 ppd for 35 years. Her mother had an MI in her 28s and her father had an MI in his 34s. Cardiac CTA 03/03/18 with calcium score of zero and no evidence of CAD. Normal ascending aorta.   She is here today for follow up. The patient denies any chest pain, dyspnea, palpitations, lower extremity edema, orthopnea, PND, dizziness, near syncope or syncope.    Primary Care Physician: Patient, No Pcp Per  Past Medical History:  Diagnosis Date  . GERD (gastroesophageal reflux disease)     Past Surgical History:  Procedure Laterality Date  . TUBAL LIGATION      Current Outpatient Medications  Medication Sig Dispense Refill  . albuterol (PROVENTIL HFA;VENTOLIN HFA) 108 (90 Base) MCG/ACT inhaler Inhale 2 puffs into the lungs every 6 (six) hours as needed for wheezing or shortness of breath.    . metoprolol tartrate (LOPRESSOR) 50 MG tablet Take one tablet by mouth 2 hours prior to CT scan 1 tablet 0  . omeprazole (PRILOSEC) 10 MG capsule Take 10 mg by mouth daily.    . varenicline (CHANTIX CONTINUING MONTH PAK) 1 MG tablet Take 1 tablet (1 mg total) by mouth 2 (two) times daily. (Patient not taking: Reported on 03/03/2018) 60 tablet 0  . varenicline (CHANTIX STARTING MONTH PAK) 0.5 MG X 11 & 1 MG X 42 tablet Take as directed (Patient not taking: Reported on 03/03/2018) 53 tablet 0   No current facility-administered medications for this visit.     Allergies  Allergen Reactions  .  Tetracyclines & Related Anaphylaxis  . Indomethacin Other (See Comments)    hallucinations    Social History   Socioeconomic History  . Marital status: Single    Spouse name: Not on file  . Number of children: 2  . Years of education: Not on file  . Highest education level: Not on file  Occupational History  . Occupation: Audiological scientist  Social Needs  . Financial resource strain: Not on file  . Food insecurity:    Worry: Not on file    Inability: Not on file  . Transportation needs:    Medical: Not on file    Non-medical: Not on file  Tobacco Use  . Smoking status: Current Every Day Smoker    Packs/day: 1.00    Years: 35.00    Pack years: 35.00  . Smokeless tobacco: Never Used  Substance and Sexual Activity  . Alcohol use: Yes    Comment: 2 glasses of wine daily   . Drug use: Never  . Sexual activity: Not on file  Lifestyle  . Physical activity:    Days per week: Not on file    Minutes per session: Not on file  . Stress: Not on file  Relationships  . Social connections:    Talks on phone: Not on file    Gets together: Not on file    Attends religious service: Not on file    Active  member of club or organization: Not on file    Attends meetings of clubs or organizations: Not on file    Relationship status: Not on file  . Intimate partner violence:    Fear of current or ex partner: Not on file    Emotionally abused: Not on file    Physically abused: Not on file    Forced sexual activity: Not on file  Other Topics Concern  . Not on file  Social History Narrative  . Not on file    Family History  Problem Relation Age of Onset  . CAD Mother        MI age 29  . CAD Father        MI age 41    Review of Systems:  As stated in the HPI and otherwise negative.   There were no vitals taken for this visit.  Physical Examination: General: Well developed, well nourished, NAD  HEENT: OP clear, mucus membranes moist  SKIN: warm, dry. No rashes. Neuro: No focal  deficits  Musculoskeletal: Muscle strength 5/5 all ext  Psychiatric: Mood and affect normal  Neck: No JVD, no carotid bruits, no thyromegaly, no lymphadenopathy.  Lungs:Clear bilaterally, no wheezes, rhonci, crackles Cardiovascular: Regular rate and rhythm. No murmurs, gallops or rubs. Abdomen:Soft. Bowel sounds present. Non-tender.  Extremities: No lower extremity edema. Pulses are 2 + in the bilateral DP/PT.  EKG:  EKG is not ordered today. The ekg ordered today demonstrates   Recent Labs: 10/12/2017: ALT 20; Hemoglobin 14.0; Platelets 220 01/06/2018: BUN 12; Potassium 4.2; Sodium 142 03/03/2018: Creatinine, Ser 0.60   Lipid Panel No results found for: CHOL, TRIG, HDL, CHOLHDL, VLDL, LDLCALC, LDLDIRECT   Wt Readings from Last 3 Encounters:  01/06/18 136 lb 9.6 oz (62 kg)  10/12/17 131 lb (59.4 kg)  01/19/14 121 lb (54.9 kg)     Other studies Reviewed: Additional studies/ records that were reviewed today include:  Review of the above records demonstrates:    Assessment and Plan:   1. Chest pain: Her pain is felt to be non-cardiac. Cardiac CTA with no evidence of CAD in Ocotber 2019. She will follow up as needed.    Current medicines are reviewed at length with the patient today.  The patient does not have concerns regarding medicines.  The following changes have been made:  no change  Labs/ tests ordered today include:   No orders of the defined types were placed in this encounter.    Disposition:   FU with me as needed.    Signed, Verne Carrow, MD 03/25/2018 7:36 AM    [facility] [address] [place], [place], [place]  [address] Phone: [phone]; Fax: [phone]

## 2018-05-31 ENCOUNTER — Other Ambulatory Visit: Payer: Self-pay | Admitting: Family Medicine

## 2018-05-31 ENCOUNTER — Other Ambulatory Visit: Payer: Self-pay

## 2018-05-31 ENCOUNTER — Ambulatory Visit
Admission: RE | Admit: 2018-05-31 | Discharge: 2018-05-31 | Disposition: A | Discharge status: Home / Self Care | Payer: Managed Care, Other (non HMO) | Source: Ambulatory Visit | Attending: Family Medicine | Admitting: Family Medicine

## 2018-05-31 DIAGNOSIS — R059 Cough, unspecified: Secondary | ICD-10-CM

## 2018-05-31 DIAGNOSIS — R05 Cough: Secondary | ICD-10-CM

## 2019-09-18 IMAGING — CT CT HEART MORP W/ CTA COR W/ SCORE W/ CA W/CM &/OR W/O CM
4 of 7 series · 8 of 20 positions shown, 9 images · IV contrast (APPLIED)
Comparison: None.

Addendum:
EXAM:
OVER-READ INTERPRETATION  CT CHEST

The following report is an over-read performed by radiologist Dr.
Cweci Payday [REDACTED] on 03/03/2018. This
over-read does not include interpretation of cardiac or coronary
anatomy or pathology. The coronary calcium score/coronary CTA
interpretation by the cardiologist is attached.
CLINICAL DATA: 52-year-old female with FH of early CAD, smoking and
atypical chest pain.
Cardiac/Coronary  CT
TECHNIQUE: The patient was scanned on a Phillips Force scanner.

[Series 6: best diast 76 % · axial · 0.39mm/px · z∈[+1178,+1221]mm · 2 of 321 slices shown, 3 images]
[im 107/321  vessel]
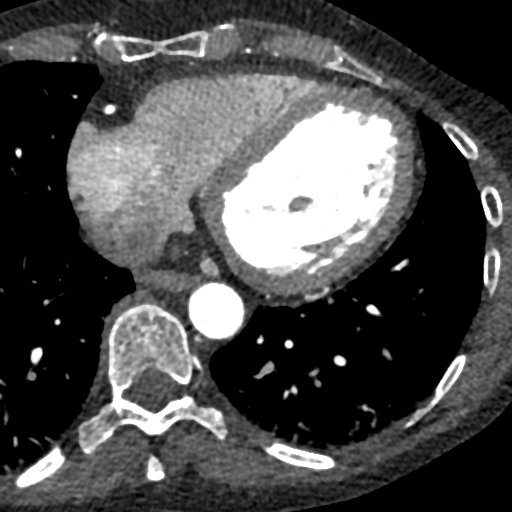
[im 107/321  lung]
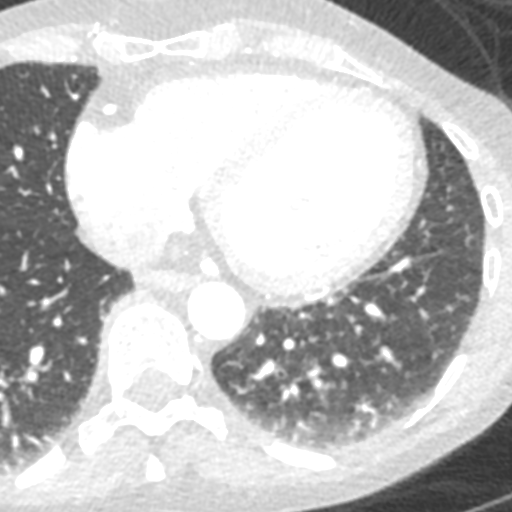
[im 214/321  vessel]
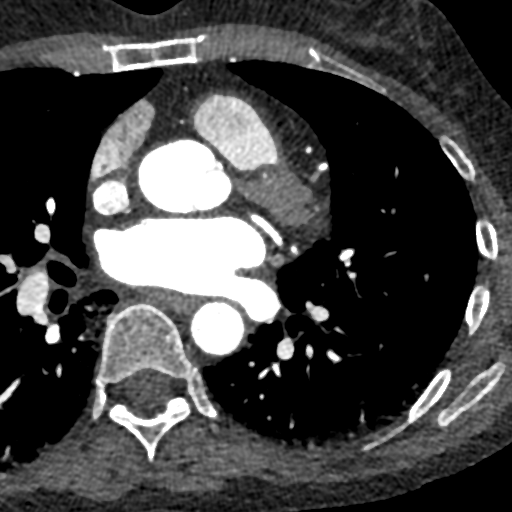

[Series 7: best syst 40 % · axial · 0.39mm/px · z∈[+1178,+1221]mm · 2 of 321 slices shown]
[im 107/321  vessel]
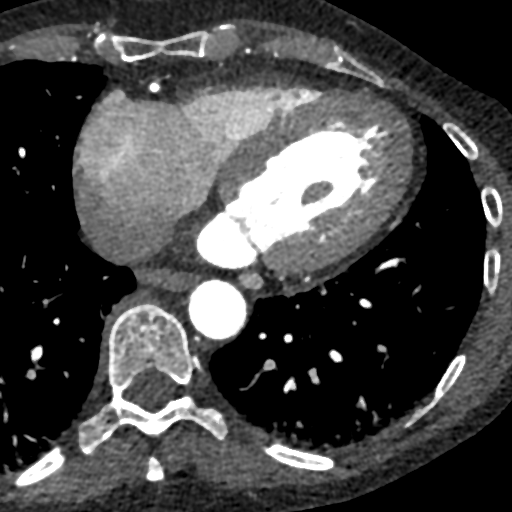
[im 214/321  vessel]
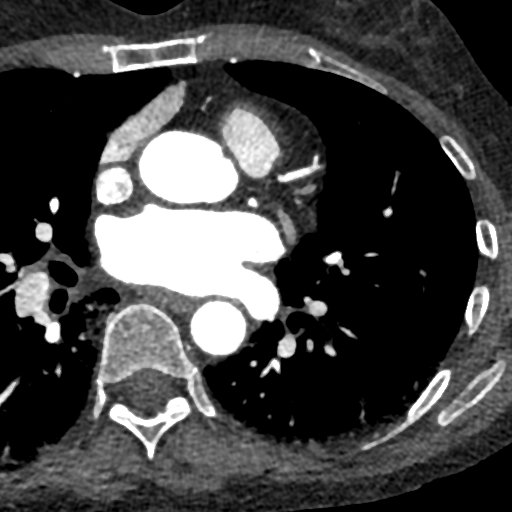

[Series 8: ts diast sharp 76 % · axial · 0.39mm/px · z∈[+1178,+1221]mm · 2 of 321 slices shown]
[im 107/321  lung]
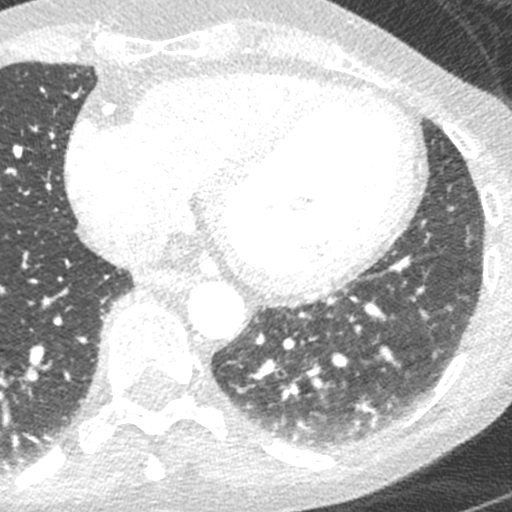
[im 214/321  lung]
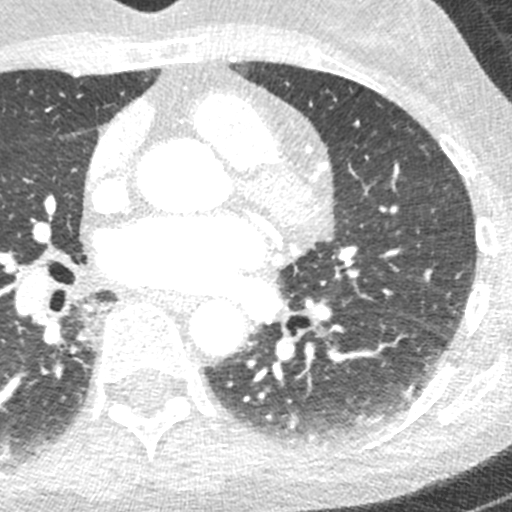

[Series 9: ts syst sharp 40 % · axial · 0.39mm/px · z∈[+1178,+1221]mm · 2 of 321 slices shown]
[im 107/321  lung]
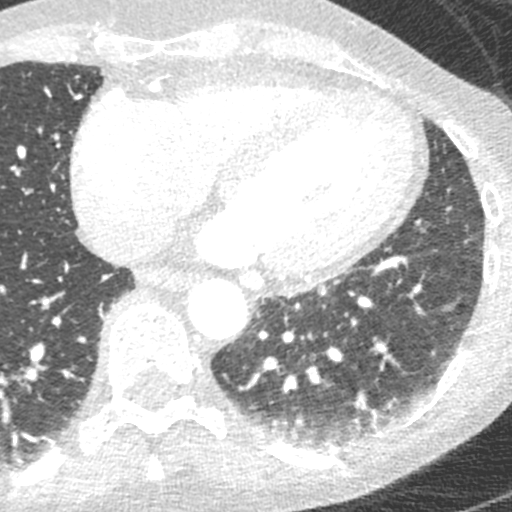
[im 214/321  lung]
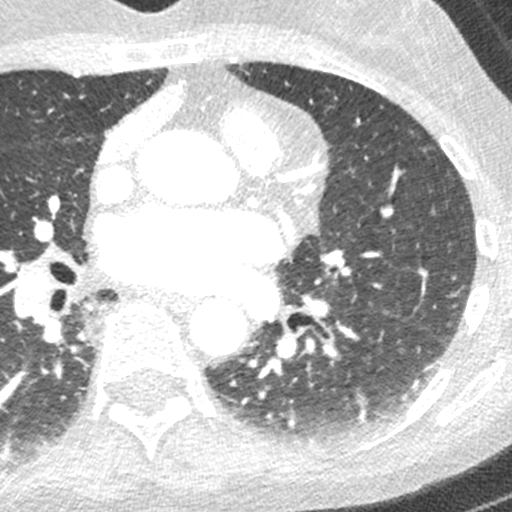

[8 of 20 positions shown; findings below may reference images not displayed]

FINDINGS: Within the visualized portions of the thorax there are no suspicious
appearing pulmonary nodules or masses, there is no acute
consolidative airspace disease, no pleural effusions, no
pneumothorax and no lymphadenopathy. Visualized portions of the
upper abdomen are unremarkable. There are no aggressive appearing
lytic or blastic lesions noted in the visualized portions of the
skeleton.
IMPRESSION: No significant incidental noncardiac findings are noted.
FINDINGS: A 120 kV prospective scan was triggered in the descending thoracic
aorta at 111 HU's. Axial non-contrast 3 mm slices were carried out
through the heart. The data set was analyzed on a dedicated work
station and scored using the Agatson method. Gantry rotation speed
was 250 msecs and collimation was .6 mm. No beta blockade and 0.8 mg
of sl NTG was given. The 3D data set was reconstructed in 5%
intervals of the 67-82 % of the R-R cycle. Diastolic phases were
analyzed on a dedicated work station using MPR, MIP and VRT modes.
The patient received 80 cc of contrast.

Aorta:  Normal size.  No calcifications.  No dissection.

Aortic Valve:  Trileaflet.  No calcifications.

Coronary Arteries:  Normal coronary origin.  Right dominance.

RCA is a large dominant artery that gives rise to PDA and PLVB.
There is no plaque.

Left main is a large artery that gives rise to LAD and LCX arteries.

LAD is a large vessel that has no plaque.

LCX is a non-dominant artery that gives rise to two OM branches.
There is no plaque.

Other findings:

Normal pulmonary vein drainage into the left atrium.

Normal let atrial appendage without a thrombus.

Normal size of the pulmonary artery.
IMPRESSION: 1. Coronary calcium score of 0. This was 0 percentile for age and
sex matched control.

2. Normal coronary origin with right dominance.

3. No evidence of CAD.

*** End of Addendum ***

## 2019-12-16 IMAGING — CR CHEST - 2 VIEW
2 series · 2 of 2 positions shown · non-contrast
Comparison: 10/12/2017

CLINICAL DATA: Chronic cough for 6 months

EXAM:
CHEST - 2 VIEW

[w chest pa]
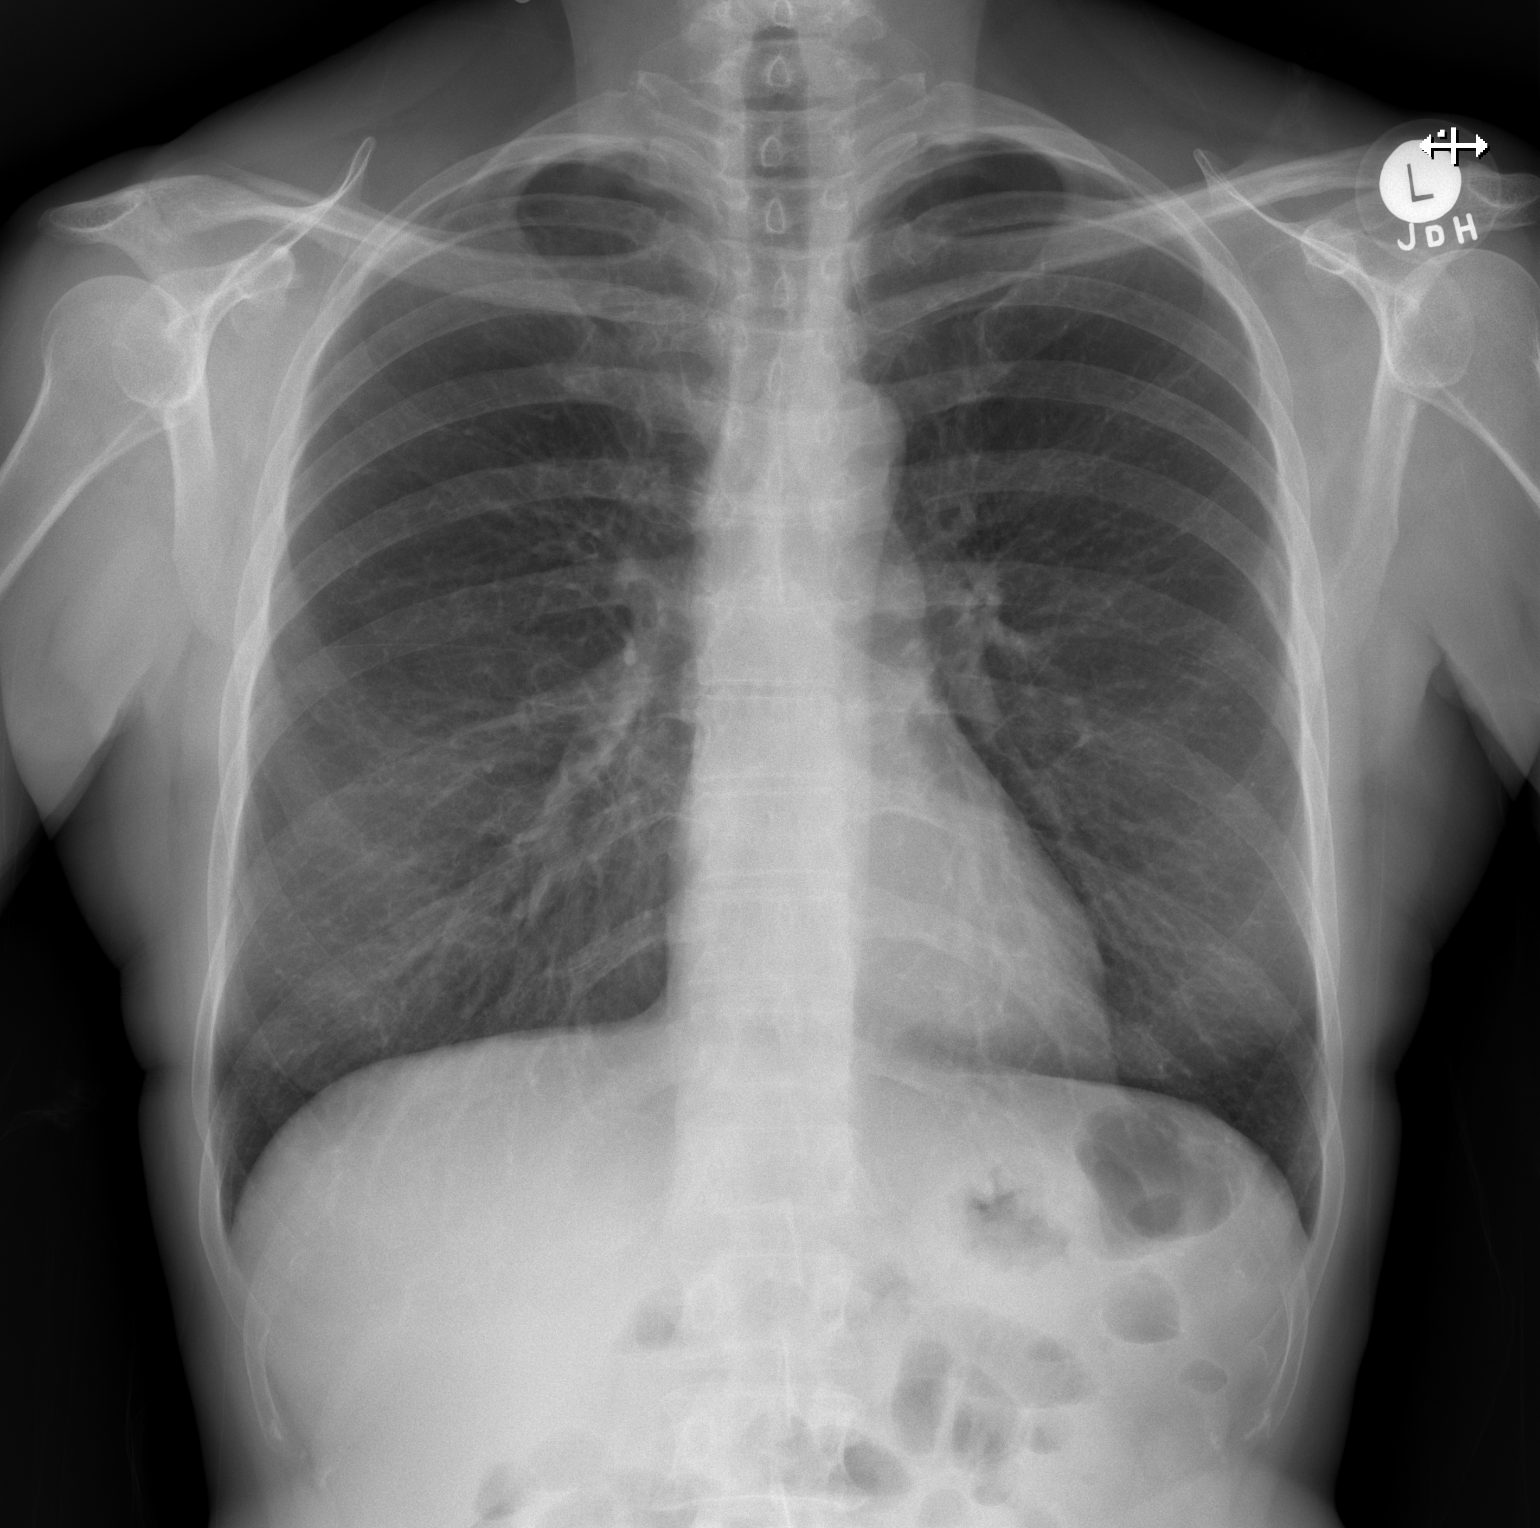

[w chest lat]
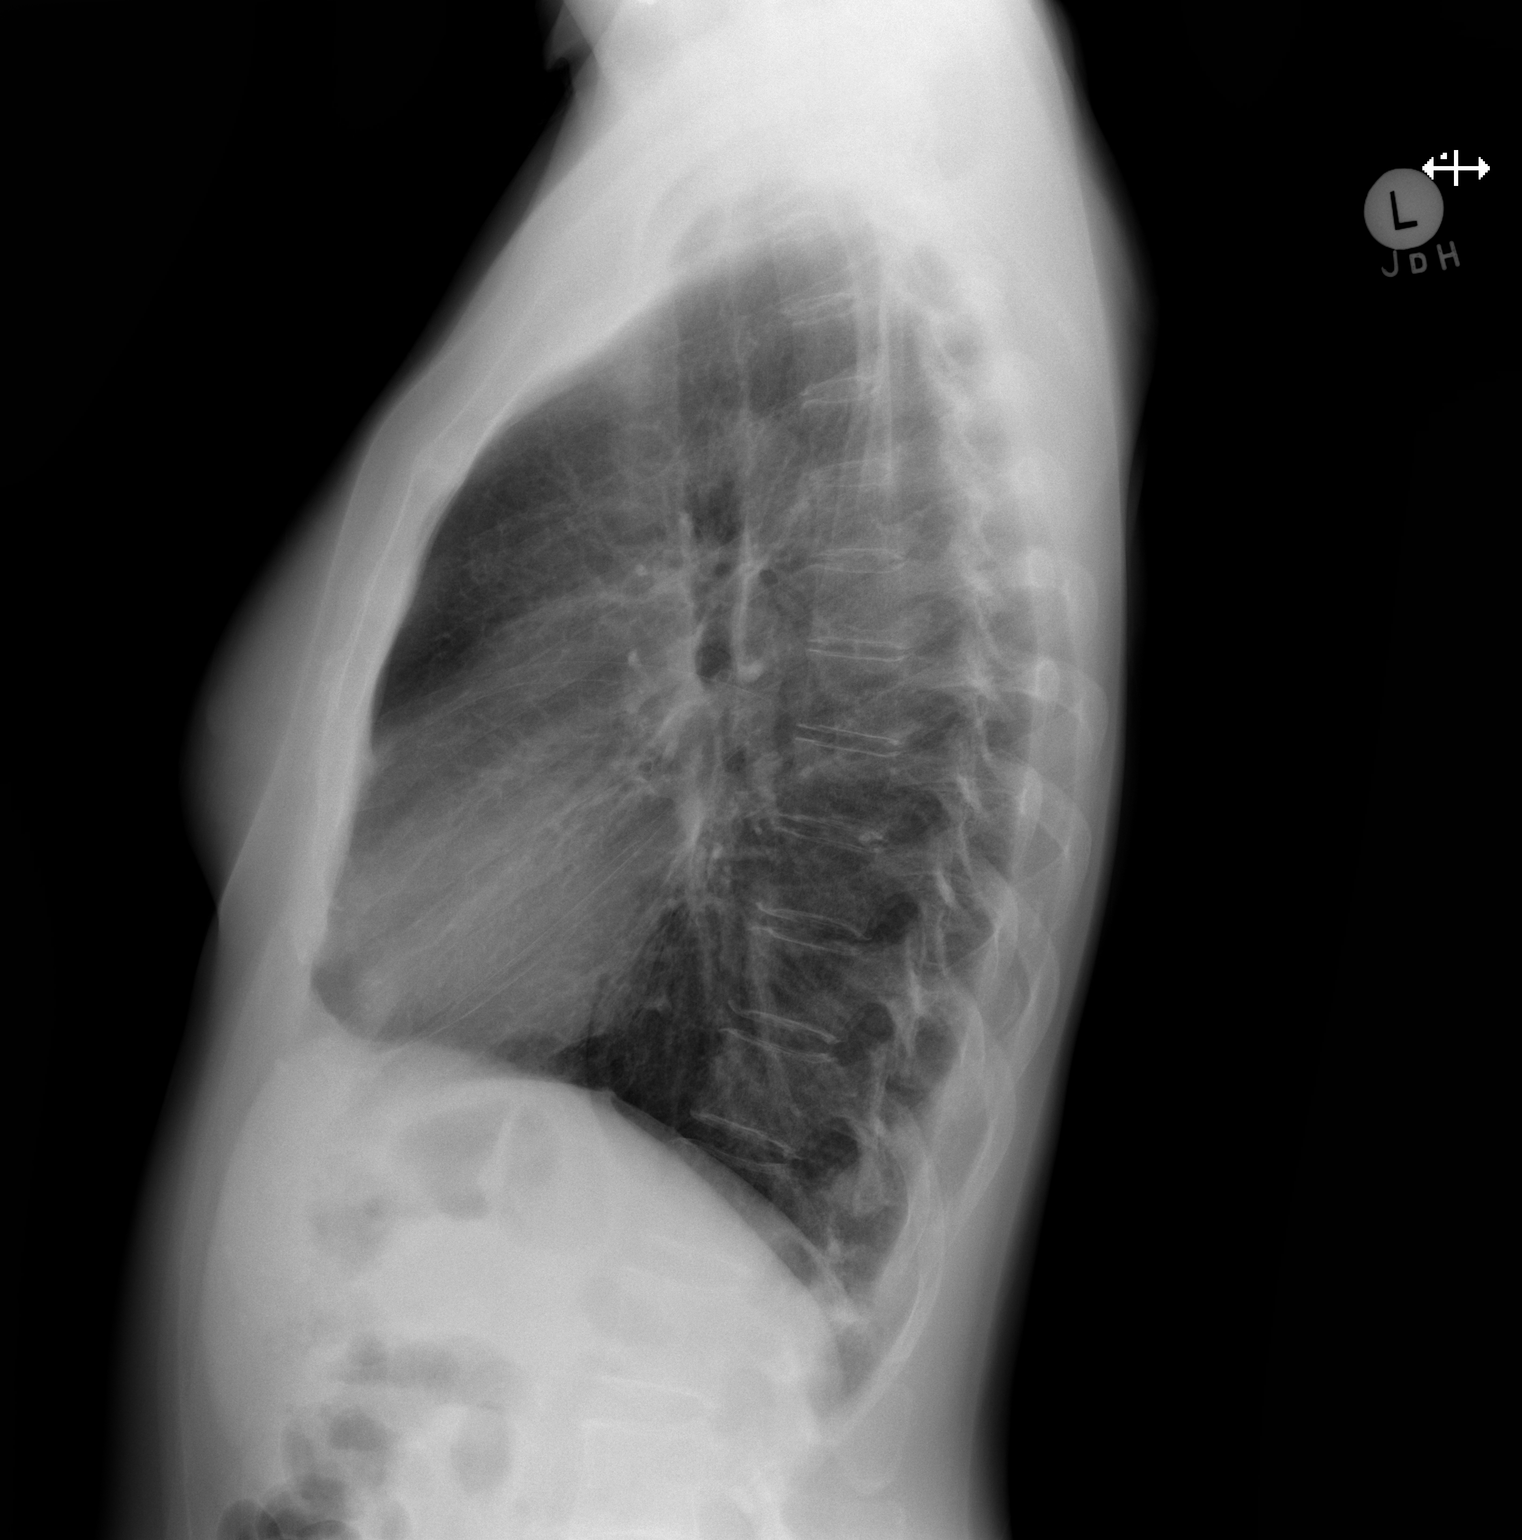

[2 of 2 positions shown; findings below may reference images not displayed]

FINDINGS: Normal heart size, mediastinal contours, and pulmonary vascularity.

Lungs clear.

No acute infiltrate, pleural effusion or pneumothorax.

Bones unremarkable.
IMPRESSION: No acute abnormalities.

## 2023-10-25 ENCOUNTER — Emergency Department (HOSPITAL_COMMUNITY)
Admission: EM | Admit: 2023-10-25 | Discharge: 2023-10-25 | Disposition: A | Discharge status: Home / Self Care | Attending: Emergency Medicine | Admitting: Emergency Medicine

## 2023-10-25 ENCOUNTER — Emergency Department (HOSPITAL_COMMUNITY)

## 2023-10-25 ENCOUNTER — Other Ambulatory Visit: Payer: Self-pay

## 2023-10-25 ENCOUNTER — Encounter (HOSPITAL_COMMUNITY): Payer: Self-pay

## 2023-10-25 DIAGNOSIS — R7989 Other specified abnormal findings of blood chemistry: Secondary | ICD-10-CM

## 2023-10-25 DIAGNOSIS — I471 Supraventricular tachycardia, unspecified: Secondary | ICD-10-CM | POA: Diagnosis not present

## 2023-10-25 DIAGNOSIS — R079 Chest pain, unspecified: Secondary | ICD-10-CM | POA: Insufficient documentation

## 2023-10-25 DIAGNOSIS — R0602 Shortness of breath: Secondary | ICD-10-CM | POA: Diagnosis not present

## 2023-10-25 LAB — CBC
HCT: 42.7 % (ref 36.0–46.0)
Hemoglobin: 14.6 g/dL (ref 12.0–15.0)
MCH: 34 pg (ref 26.0–34.0)
MCHC: 34.2 g/dL (ref 30.0–36.0)
MCV: 99.5 fL (ref 80.0–100.0)
Platelets: 219 K/uL (ref 150–400)
RBC: 4.29 MIL/uL (ref 3.87–5.11)
RDW: 13.2 % (ref 11.5–15.5)
WBC: 7.7 K/uL (ref 4.0–10.5)
nRBC: 0 % (ref 0.0–0.2)

## 2023-10-25 LAB — BASIC METABOLIC PANEL WITH GFR
Anion gap: 13 (ref 5–15)
BUN: 12 mg/dL (ref 6–20)
CO2: 23 mmol/L (ref 22–32)
Calcium: 9.6 mg/dL (ref 8.9–10.3)
Chloride: 103 mmol/L (ref 98–111)
Creatinine, Ser: 0.89 mg/dL (ref 0.44–1.00)
GFR, Estimated: >60 mL/min (ref >60)
Glucose, Bld: 118 mg/dL — ABNORMAL HIGH (ref 70–99)
Potassium: 3.4 mmol/L — ABNORMAL LOW (ref 3.5–5.1)
Sodium: 139 mmol/L (ref 135–145)

## 2023-10-25 LAB — TROPONIN I (HIGH SENSITIVITY)
Troponin I (High Sensitivity): 10 ng/L (ref <18)
Troponin I (High Sensitivity): 56 ng/L — ABNORMAL HIGH (ref <18)

## 2023-10-25 MED ORDER — SODIUM CHLORIDE 0.9 % IV BOLUS
1000.0000 mL | Freq: Once | INTRAVENOUS | Status: AC
  Administered 2023-10-25: 1000 mL via INTRAVENOUS

## 2023-10-25 MED ORDER — METOPROLOL TARTRATE 50 MG PO TABS: 50.0000 mg | ORAL_TABLET | Freq: Two times a day (BID) | ORAL | 0 refills | Status: DC | PRN

## 2023-10-25 NOTE — ED Provider Notes (Signed)
 [place] EMERGENCY DEPARTMENT AT [facility] Provider Note   CSN: [id number] Arrival date & time: 10/25/23  1315     Patient presents with: Chest Pain   Jaime Rich is a 58 y.o. female.  With a history of GERD who presents to the ED for chest pain.  Patient first experienced a tightness and pounding in her chest while at rest at home.  The symptoms persisted for about 40 minutes prior to arrival.  She measured her heart rate to be in the 160s at that time.  Some associated shortness of breath.  EMS gave 324 aspirin and sublingual nitro and route.  Chest pain-free currently.  No fevers chills recent illness.  She has not had similar episodes of chest pain at rest in the past.    Chest Pain      Prior to Admission medications   Medication Sig Start Date End Date Taking? Authorizing Provider  albuterol (PROVENTIL HFA;VENTOLIN HFA) 108 (90 Base) MCG/ACT inhaler Inhale 2 puffs into the lungs every 6 (six) hours as needed for wheezing or shortness of breath.    [provider]  metoprolol  tartrate (LOPRESSOR ) 50 MG tablet Take one tablet by mouth 2 hours prior to CT scan 01/06/18   Verlin Lonni BIRCH, MD  omeprazole (PRILOSEC) 10 MG capsule Take 10 mg by mouth daily.    [provider]  varenicline  (CHANTIX  CONTINUING MONTH PAK) 1 MG tablet Take 1 tablet (1 mg total) by mouth 2 (two) times daily. Patient not taking: Reported on 03/03/2018 01/06/18   Verlin Lonni BIRCH, MD  varenicline  (CHANTIX  STARTING MONTH PAK) 0.5 MG X 11 & 1 MG X 42 tablet Take as directed Patient not taking: Reported on 03/03/2018 01/06/18   Verlin Lonni BIRCH, MD    Allergies: Tetracyclines & related and Indomethacin    Review of Systems  Cardiovascular:  Positive for chest pain.    Updated Vital Signs BP (!) 147/92   Pulse 80   Temp 98.4 F (36.9 C) (Oral)   Resp 18   Ht 5' 9 (1.753 m)   Wt 63 kg   SpO2 100%   BMI 20.53 kg/m   Physical Exam Vitals and  nursing note reviewed.  HENT:     Head: Normocephalic and atraumatic.  Eyes:     Pupils: Pupils are equal, round, and reactive to light.  Cardiovascular:     Rate and Rhythm: Normal rate and regular rhythm.  Pulmonary:     Effort: Pulmonary effort is normal.     Breath sounds: Normal breath sounds.  Abdominal:     Palpations: Abdomen is soft.     Tenderness: There is no abdominal tenderness.  Skin:    General: Skin is warm and dry.  Neurological:     Mental Status: She is alert.  Psychiatric:        Mood and Affect: Mood normal.     (all labs ordered are listed, but only abnormal results are displayed) Labs Reviewed  BASIC METABOLIC PANEL WITH GFR - Abnormal; Notable for the following components:      Result Value   Potassium 3.4 (*)    Glucose, Bld 118 (*)    All other components within normal limits  CBC  TROPONIN I (HIGH SENSITIVITY)  TROPONIN I (HIGH SENSITIVITY)    EKG: EKG Interpretation Date/Time:  Sunday October 25 2023 13:28:21 EDT Ventricular Rate:  102 PR Interval:  131 QRS Duration:  88 QT Interval:  339 QTC Calculation: 442 R  Axis:   71  Text Interpretation: Sinus tachycardia Borderline ST depression, diffuse leads Confirmed by Pamella Sharper [phone]) on 10/25/2023 2:28:40 PM  Radiology: DG Chest 2 View Result Date: 10/25/2023 EXAM: 2 VIEW(S) XRAY OF THE CHEST 10/25/2023 01:58:00 PM COMPARISON: 05/31/2018 CLINICAL HISTORY: Chest tightness and tachycardia. Per EMS, patient from home, complains of central chest tightness/pressure lasting around 40 minutes. Symptoms have currently resolved. Patient reports her heart rate got into the 160s. FINDINGS: LUNGS AND PLEURA: Hyperinflation and lower lung predominant mild interstitial thickening. No focal pulmonary opacity. No pulmonary edema. No pleural effusion. No pneumothorax. HEART AND MEDIASTINUM: No acute abnormality of the cardiac and mediastinal silhouettes. BONES AND SOFT TISSUES: No acute osseous abnormality.  Multiple wires and leads project over the chest on the frontal radiograph. Interval breast implants. IMPRESSION: 1. No acute findings. 2. 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 Electronically signed by: Rockey Kilts MD 10/25/2023 02:26 PM EDT RP Workstation: [id number]     Procedures   Medications Ordered in the ED  sodium chloride  0.9 % bolus 1,000 mL (1,000 mLs Intravenous New Bag/Given 10/25/23 1434)    Clinical Course as of 10/25/23 1552  Sun Oct 25, 2023  1520 Initial laboratory workup unremarkable.  First high-sensitivity troponin of 10.  Chest x-ray shows no acute disease.  Patient has remained stable with normal sinus rhythm in the 80s.  LILLETTE Sharper Pamella DO, am transitioning care of this patient to the oncoming provider pending delta troponin reevaluation and disposition [MP]    Clinical Course User Index [MP] Pamella Sharper LABOR, DO                                 Medical Decision Making 58 year old female with history as above presenting to ED given concern for chest pain.  Chest pain started at rest at home with associated shortness of breath.  Describes it as a pressure sensation.  Measured her heart rate and was noted to be tachycardic in the 160s at that time.  Resolved spontaneously at home.  No history of similar episodes.  Was given 324 aspirin and nitro prior to arrival.  Asymptomatic at the time of my assessment with heart rate in the 90s normal sinus rhythm.  Comfortable on room air.  Benign physical exam.  Presentation concerning for potential ACS versus dysrhythmia such as SVT or A-fib, electrolyte imbalance, viral respiratory illness, pneumonia or anemia.  Will obtain laboratory workup EKG and chest x-ray and continue to monitor on telemetry.  Amount and/or Complexity of Data Reviewed Labs: ordered. Radiology: ordered.        Final diagnoses:  Chest pain, unspecified type    ED Discharge  Orders     None          Pamella Sharper LABOR, DO 10/25/23 1552

## 2023-10-25 NOTE — Discharge Instructions (Signed)
 As we discussed, your heart rate was high and likely causing some strain in your heart  If your heart rate is persistently elevated over 100, I recommend you take 50 mg of metoprolol .  Wait another 30 minutes and if you still have elevated heart rate you can take another 50 mg.  If this does not help your heart rate you need to come immediately to the ER  The cardiologist will set you up to see an electrophysiologist  Return to ER if you have worse chest pain or shortness of breath

## 2023-10-25 NOTE — ED Notes (Signed)
 PT acknowledge understanding of discharge instructions. Opportunity to ask questions if needed.

## 2023-10-25 NOTE — Consult Note (Signed)
 Cardiology Consultation:   Patient ID: Jaime Rich MRN: [id number]; DOB: 17-Jul-1965  Admit date: 10/25/2023 Date of Consult: 10/26/2023  Primary Care Provider: Regino Slater, MD [facility] Cardiologist: Lonni Cash, MD  [facility] Electrophysiologist:  None   Patient Profile:   Jaime Rich is a 58 y.o. female with GERD who is being seen today for the evaluation of chest pain and elevated hsT at the request of Dr. Patt.  History of Present Illness:   Jaime Rich experienced sudden onset central chest tightness/pressure and SOB for 40 minutes duration with associated tachycardia (HR 160s) with abrupt resolution of symptoms with normalization of heart rate.  On EMS arrival she was still tachycardic and was given ASA 324 mg PO and SLNG x1 en route.   VS on ED arrival: BP 147/92, P 80, RR 18, T 98.4, O2 100%/RA.   During ED evaluation she was NSR in the 90s and completely asx.  VS during my evaluation: BP 140/87 (101), P 76, 100%/RA, RR 14.  She reports that she was sitting down when her symptoms abruptly started.  She noticed that her chest was pounding and her heart rate was 160s.  When EMS arrived she was still tachycardic to the 130-140s and symptomatic.  In the route to the hospital she noticed abrupt improvement in her heart rate and resolution of her symptoms.  On arrival to the emergency department she was in sinus tach 100s.   She has had similar episodes happen twice before with most recent of which was 6 to 8 months ago.  Both of her prior episodes only lasted for 2 minutes duration and he did not require medical intervention or hospital evaluation.  Past Medical History:  Diagnosis Date   GERD (gastroesophageal reflux disease)    Past Surgical History:  Procedure Laterality Date   TUBAL LIGATION       Home Medications:  Prior to Admission medications   Medication Sig Start Date End Date Taking? Authorizing Provider  metoprolol  tartrate (LOPRESSOR ) 50 MG  tablet Take 1 tablet (50 mg total) by mouth 2 (two) times daily as needed. 10/25/23  Yes Patt Alm Macho, MD  albuterol (PROVENTIL HFA;VENTOLIN HFA) 108 (90 Base) MCG/ACT inhaler Inhale 2 puffs into the lungs every 6 (six) hours as needed for wheezing or shortness of breath.    [provider]  metoprolol  tartrate (LOPRESSOR ) 50 MG tablet Take one tablet by mouth 2 hours prior to CT scan 01/06/18   Cash Lonni BIRCH, MD  omeprazole (PRILOSEC) 10 MG capsule Take 10 mg by mouth daily.    [provider]  varenicline  (CHANTIX  CONTINUING MONTH PAK) 1 MG tablet Take 1 tablet (1 mg total) by mouth 2 (two) times daily. Patient not taking: Reported on 03/03/2018 01/06/18   Cash Lonni BIRCH, MD  varenicline  (CHANTIX  STARTING MONTH PAK) 0.5 MG X 11 & 1 MG X 42 tablet Take as directed Patient not taking: Reported on 03/03/2018 01/06/18   Cash Lonni BIRCH, MD   Allergies:    Allergies  Allergen Reactions   Tetracyclines & Related Anaphylaxis   Indomethacin Other (See Comments)    hallucinations   Social History:   Social History   Socioeconomic History   Marital status: Single    Spouse name: Not on file   Number of children: 2   Years of education: Not on file   Highest education level: Not on file  Occupational History   Occupation: Accounting  Tobacco Use   Smoking status: Every  Day    Current packs/day: 1.00    Average packs/day: 1 pack/day for 35.0 years (35.0 ttl pk-yrs)    Types: Cigarettes    Passive exposure: Never   Smokeless tobacco: Never  Vaping Use   Vaping status: Never Used  Substance and Sexual Activity   Alcohol use: Yes    Comment: 2 glasses of wine daily    Drug use: Never   Sexual activity: Not on file  Other Topics Concern   Not on file  Social History Narrative   Not on file   Social Drivers of Health   Financial Resource Strain: Not on file  Food Insecurity: Low Risk  (09/09/2023)   Received from [facility]    Hunger Vital Sign    Within the past 12 months, you worried that your food would run out before you got money to buy more: Never true    Within the past 12 months, the food you bought just didn't last and you didn't have money to get more. : Never true  Transportation Needs: No Transportation Needs (09/09/2023)   Received from [employer]    In the past 12 months, has lack of reliable transportation kept you from medical appointments, meetings, work or from getting things needed for daily living? : No  Physical Activity: Not on file  Stress: Not on file  Social Connections: Not on file  Intimate Partner Violence: Not on file    Family History:    Family History  Problem Relation Age of Onset   CAD Mother        MI age 42   CAD Father        MI age 9    ROS:  Review of Systems: [y] = yes, [ ]  = no      General: Weight gain [ ] ; Weight loss [ ] ; Anorexia [ ] ; Fatigue [ ] ; Fever [ ] ; Chills [ ] ; Weakness [ ]    Cardiac: Chest pain/pressure [ ] ; Resting SOB [ ] ; Exertional SOB [ ] ; Orthopnea [ ] ; Pedal Edema [ ] ; Palpitations [y]; Syncope [ ] ; Presyncope [ ] ; Paroxysmal nocturnal dyspnea [ ]    Pulmonary: Cough [ ] ; Wheezing [ ] ; Hemoptysis [ ] ; Sputum [ ] ; Snoring [ ]    GI: Vomiting [ ] ; Dysphagia [ ] ; Melena [ ] ; Hematochezia [ ] ; Heartburn [ ] ; Abdominal pain [ ] ; Constipation [ ] ; Diarrhea [ ] ; BRBPR [ ]    GU: Hematuria [ ] ; Dysuria [ ] ; Nocturia [ ]  Vascular: Pain in legs with walking [ ] ; Pain in feet with lying flat [ ] ; Non-healing sores [ ] ; Stroke [ ] ; TIA [ ] ; Slurred speech [ ] ;   Neuro: Headaches [ ] ; Vertigo [ ] ; Seizures [ ] ; Paresthesias [ ] ;Blurred vision [ ] ; Diplopia [ ] ; Vision changes [ ]    Ortho/Skin: Arthritis [ ] ; Joint pain [ ] ; Muscle pain [ ] ; Joint swelling [ ] ; Back Pain [ ] ; Rash [ ]    Psych: Depression [ ] ; Anxiety [ ]    Heme: Bleeding problems [ ] ; Clotting disorders [ ] ; Anemia [ ]    Endocrine: Diabetes [ ] ; Thyroid dysfunction [ ]     Physical Exam/Data:   Vitals:   10/25/23 1730 10/25/23 1800 10/25/23 1830 10/25/23 1850  BP: (!) 140/87 (!) 150/82 (!) 141/93   Pulse: 74 74 75   Resp: (!) 24 (!) 22 20   Temp:    98.4 F (36.9 C)  TempSrc:  Oral  SpO2: 98% 99% 99%   Weight:      Height:       No intake or output data in the 24 hours ending 10/26/23 0001    10/25/2023    1:28 PM 01/06/2018    8:51 AM 10/12/2017   10:47 AM  Last 3 Weights  Weight (lbs) 139 lb 136 lb 9.6 oz 131 lb  Weight (kg) 63.05 kg 61.961 kg 59.421 kg     Body mass index is 20.53 kg/m.  General:  Well nourished, well developed, in no acute distress HEENT: normal Neck: no JVD Vascular: No carotid bruits; FA pulses 2+ bilaterally without bruits  Cardiac:  normal S1, S2; RRR; no murmur  Lungs:  clear to auscultation bilaterally, no wheezing, rhonchi or rales  Abd: soft, nontender, no hepatomegaly  Ext: no edema Musculoskeletal:  No deformities, BUE and BLE strength normal and equal Skin: warm and dry  Neuro:  CNs 2-12 intact, no focal abnormalities noted Psych:  Normal affect   EKG:  The ECG was personally reviewed and demonstrated:  10/25/23 (13:28:21): sinus tach 102, PR 131, QRS 88, QT/c 339/442, minimal ST depressions in V3-V5, no reciprocal elevations  Rhythm strip via EMS (10/25/23, 12:42:56): SVT 133, retrograde P waves best seen in V3 (apparent in inferior leads as well), short RP tachycardia   Telemetry: Telemetry was personally reviewed and demonstrates: ST 100s->NSR 70s   Relevant CV Studies:  cCT/CAC Result date: 03/03/18 1. Coronary calcium score of 0. This was 0 percentile for age and sex matched control. 2. Normal coronary origin with right dominance. 3. No evidence of CAD.   Laboratory Data:  High Sensitivity Troponin:   Recent Labs  Lab 10/25/23 1332 10/25/23 1547  TROPONINIHS 10 56*     Chemistry Recent Labs  Lab 10/25/23 1332  NA 139  K 3.4*  CL 103  CO2 23  GLUCOSE 118*  BUN 12   CREATININE 0.89  CALCIUM 9.6  GFRNONAA >60  ANIONGAP 13    Hematology Recent Labs  Lab 10/25/23 1332  WBC 7.7  RBC 4.29  HGB 14.6  HCT 42.7  MCV 99.5  MCH 34.0  MCHC 34.2  RDW 13.2  PLT 219    Radiology/Studies:  DG Chest 2 View Result Date: 10/25/2023 EXAM: 2 VIEW(S) XRAY OF THE CHEST 10/25/2023 01:58:00 PM COMPARISON: 05/31/2018 CLINICAL HISTORY: Chest tightness and tachycardia. Per EMS, patient from home, complains of central chest tightness/pressure lasting around 40 minutes. Symptoms have currently resolved. Patient reports her heart rate got into the 160s. FINDINGS: LUNGS AND PLEURA: Hyperinflation and lower lung predominant mild interstitial thickening. No focal pulmonary opacity. No pulmonary edema. No pleural effusion. No pneumothorax. HEART AND MEDIASTINUM: No acute abnormality of the cardiac and mediastinal silhouettes. BONES AND SOFT TISSUES: No acute osseous abnormality. Multiple wires and leads project over the chest on the frontal radiograph. Interval breast implants. IMPRESSION: 1. No acute findings. 2. 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 Electronically signed by: Rockey Kilts MD 10/25/2023 02:26 PM EDT RP Workstation: [id number]   Assessment and Plan:  Jaime Rich is a 58 y.o. female with GERD who presents with symptomatic short RP SVT.   SVT This is the third time she has had SVT in the past 2 years. The most recent episode was 6-8 months ago. The prior to episodes only lasted 2 minutes before self terminating.  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  We discussed different options for management of her SVT including Holter monitor, ILR implant, and referral to EP for consideration of further diagnostic testing and possible ablation.  Because she has only had 3 episodes in the  past 2 years I do not think that Holter monitor will provide much additional data.  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  She does not want to be on long-acting daily medication but would like something to take if her symptoms persist for more than a few minutes.  She was interested in referral to EP to establish care for consideration of EP testing and possible ablation in the future if her symptoms become more bothersome. - metop tartrate 50 mg q30 minutes prn SVT - referral to cardiology/EP for further discussion and management of SVT  [facility] will sign off.   The patient is ready for discharge today from a cardiac standpoint. Medication Recommendations: metop tartrate as above  Other recommendations (labs, testing, etc): none Follow up as an outpatient: routine OP referral to EP/cardiology   For questions or updates, please contact [facility] Please consult [email or web address] for contact info under   Signed, [name], MD  10/26/2023 12:01 AM

## 2023-10-25 NOTE — ED Provider Notes (Signed)
  Physical Exam  BP (!) 150/82   Pulse 74   Temp 98.4 F (36.9 C) (Oral)   Resp (!) 22   Ht 5' 9 (1.753 m)   Wt 63 kg   SpO2 99%   BMI 20.53 kg/m   Physical Exam  Procedures  Procedures  ED Course / MDM   Clinical Course as of 10/25/23 1836  Sun Oct 25, 2023  1520 Initial laboratory workup unremarkable.  First high-sensitivity troponin of 10.  Chest x-ray shows no acute disease.  Patient has remained stable with normal sinus rhythm in the 80s.  LILLETTE Ozell Marine DO, am transitioning care of this patient to the oncoming provider pending delta troponin reevaluation and disposition [MP]    Clinical Course User Index [MP] Marine Ozell LABOR, DO   Medical Decision Making Care assumed at 3 PM.  Patient had episode of chest pressure and palpitations.  Patient had initial troponin of 10.  Signed out pending second troponin  4 pm Second troponin is elevated at 56.  I discussed with Dr. Ledell from cardiology.  He states that he will see the patient  6:36 PM Dr. Ledell saw the patient and recommend EP follow-up.  He also recommends metoprolol  50 mg as needed.  Patient knows to return to the ER if her heart rate still persistently elevated after taking 2 doses of metoprolol .  Amount and/or Complexity of Data Reviewed Labs: ordered. Radiology: ordered.  Risk Prescription drug management.          Patt Alm Macho, MD 10/25/23 [phone]

## 2023-10-25 NOTE — ED Notes (Signed)
 EDP made aware Troponin increased.

## 2023-10-25 NOTE — ED Triage Notes (Signed)
 Per EMS, Pt, from home, c/o central chest tightness/pressure lasting around .  Symptoms have currently resolved.  Pt reports her heart rate got into the 160s.   Pt given 324mg  aspirin and Nitroglycerin  x1 en route.

## 2023-10-28 NOTE — Progress Notes (Signed)
 Referring-Dibas Koirala MD Reason for referral-chest pain  HPI: 58 year old female for evaluation of chest pain at request of Dibas Koirala MD. Previously followed by Dr. Verlin.  Coronary CTA December 2019 showed calcium score of 0 and no coronary disease.  Seen in the emergency room October 25, 2023 with chest pain.  Troponin was 10 and 56.  Hemoglobin of 14.6 and creatinine 0.89.  Chest x-ray showed COPD.  Patient was seen by Dr. Almetta.  Her symptoms of chest pressure began when her heart rate increased to 160 (documented to be in SVT by EMS strips likely AVNRT).  Her symptoms resolved with treatment.  Now returns for follow-up.  Denies dyspnea on exertion, orthopnea, PND, pedal edema, exertional chest pain or syncope.  She has had 4 separate episodes of her heart suddenly racing.  Typically last 2 or 3 minutes and resolve spontaneously.  Most recent episode would not resolve.  There was associated chest tightness and dyspnea but no syncope.  Current Outpatient Medications  Medication Sig Dispense Refill   metoprolol  tartrate (LOPRESSOR ) 50 MG tablet Take 1 tablet (50 mg total) by mouth 2 (two) times daily as needed. 30 tablet 0   omeprazole (PRILOSEC) 10 MG capsule Take 10 mg by mouth daily.     albuterol (PROVENTIL HFA;VENTOLIN HFA) 108 (90 Base) MCG/ACT inhaler Inhale 2 puffs into the lungs every 6 (six) hours as needed for wheezing or shortness of breath. (Patient not taking: Reported on 10/29/2023)     metoprolol  tartrate (LOPRESSOR ) 50 MG tablet Take one tablet by mouth 2 hours prior to CT scan (Patient not taking: Reported on 10/29/2023) 1 tablet 0   varenicline  (CHANTIX  CONTINUING MONTH PAK) 1 MG tablet Take 1 tablet (1 mg total) by mouth 2 (two) times daily. (Patient not taking: Reported on 10/29/2023) 60 tablet 0   varenicline  (CHANTIX  STARTING MONTH PAK) 0.5 MG X 11 & 1 MG X 42 tablet Take as directed (Patient not taking: Reported on 10/29/2023) 53 tablet 0   No current  facility-administered medications for this visit.    Allergies  Allergen Reactions   Tetracyclines & Related Anaphylaxis   Indomethacin Other (See Comments)    hallucinations     Past Medical History:  Diagnosis Date   GERD (gastroesophageal reflux disease)     Past Surgical History:  Procedure Laterality Date   TUBAL LIGATION      Social History   Socioeconomic History   Marital status: Single    Spouse name: Not on file   Number of children: 2   Years of education: Not on file   Highest education level: Not on file  Occupational History   Occupation: Accounting  Tobacco Use   Smoking status: Some Days    Current packs/day: 1.00    Average packs/day: 1 pack/day for 35.0 years (35.0 ttl pk-yrs)    Types: Cigarettes    Passive exposure: Never   Smokeless tobacco: Never  Vaping Use   Vaping status: Never Used  Substance and Sexual Activity   Alcohol use: Yes    Comment: 2 glasses of wine daily    Drug use: Never   Sexual activity: Not on file  Other Topics Concern   Not on file  Social History Narrative   Not on file   Social Drivers of Health   Financial Resource Strain: Not on file  Food Insecurity: Low Risk  (09/09/2023)   Received from [facility]   Hunger Vital Sign    Within  the past 12 months, you worried that your food would run out before you got money to buy more: Never true    Within the past 12 months, the food you bought just didn't last and you didn't have money to get more. : Never true  Transportation Needs: No Transportation Needs (09/09/2023)   Received from [facility]    In the past 12 months, has lack of reliable transportation kept you from medical appointments, meetings, work or from getting things needed for daily living? : No  Physical Activity: Not on file  Stress: Not on file  Social Connections: Not on file  Intimate Partner Violence: Not on file    Family History  Problem Relation Age of Onset   CAD  Mother        MI age 87   CAD Father        MI age 32    ROS: no fevers or chills, productive cough, hemoptysis, dysphasia, odynophagia, melena, hematochezia, dysuria, hematuria, rash, seizure activity, orthopnea, PND, pedal edema, claudication. Remaining systems are negative.  Physical Exam:   Blood pressure (!) 126/90, pulse 77, height 5' 9 (1.753 m), weight 140 lb (63.5 kg), SpO2 97%.  General:  Well developed/well nourished in NAD Skin warm/dry Patient not depressed No peripheral clubbing Back-normal HEENT-normal/normal eyelids Neck supple/normal carotid upstroke bilaterally; no bruits; no JVD; no thyromegaly chest - CTA/ normal expansion CV - RRR/normal S1 and S2; no murmurs, rubs or gallops;  PMI nondisplaced Abdomen -NT/ND, no HSM, no mass, + bowel sounds, positive bruit 2+ femoral pulses, no bruits Ext-no edema, chords, 2+ DP Neuro-grossly nonfocal  ECG -October 25, 2023-sinus tachycardia with nonspecific ST changes.  Personally reviewed  A/P  1 supraventricular tachycardia-patient is having recurrent episodes of SVT over the past 1 year.  Will add Toprol  25 mg nightly.  Schedule echocardiogram to reassess LV function.  She is scheduled to see Dr. Almetta for ablation in September.  2 tobacco abuse-patient counseled on discontinuing.  3 chest pain/elevated troponin-likely secondary to SVT.  Will arrange coronary CTA to rule out obstructive coronary disease.  4 abdominal bruit-patient has a strong family history of abdominal aortic aneurysm.  Her daughter died of ruptured aneurysm at age 12.  Will arrange abdominal ultrasound to rule out aneurysm.  Redell Shallow, MD

## 2023-10-29 ENCOUNTER — Encounter: Payer: Self-pay | Admitting: Cardiology

## 2023-10-29 ENCOUNTER — Ambulatory Visit: Attending: Cardiology | Admitting: Cardiology

## 2023-10-29 VITALS — BP 126/90 | HR 77 | Ht 69.0 in | Wt 140.0 lb

## 2023-10-29 DIAGNOSIS — R072 Precordial pain: Secondary | ICD-10-CM | POA: Diagnosis not present

## 2023-10-29 DIAGNOSIS — I471 Supraventricular tachycardia, unspecified: Secondary | ICD-10-CM

## 2023-10-29 DIAGNOSIS — Z720 Tobacco use: Secondary | ICD-10-CM

## 2023-10-29 DIAGNOSIS — R7989 Other specified abnormal findings of blood chemistry: Secondary | ICD-10-CM

## 2023-10-29 DIAGNOSIS — R0989 Other specified symptoms and signs involving the circulatory and respiratory systems: Secondary | ICD-10-CM

## 2023-10-29 MED ORDER — METOPROLOL SUCCINATE ER 25 MG PO TB24: 25.0000 mg | ORAL_TABLET | Freq: Every day | ORAL | 3 refills | Status: DC

## 2023-10-29 NOTE — Patient Instructions (Signed)
 Medication Instructions:   START METOPROLOL  SUCC ER 25 MG ONCE DAILY AT BEDTIME  *If you need a refill on your cardiac medications before your next appointment, please call your pharmacy*   Testing/Procedures:   Your physician has requested that you have an echocardiogram. Echocardiography is a painless test that uses sound waves to create images of your heart. It provides your doctor with information about the size and shape of your heart and how well your heart's chambers and valves are working. This procedure takes approximately one hour. There are no restrictions for this procedure. Please do NOT wear cologne, perfume, aftershave, or lotions (deodorant is allowed). Please arrive 15 minutes prior to your appointment time.  Please note: We ask at that you not bring children with you during ultrasound (echo/ vascular) testing. Due to room size and safety concerns, children are not allowed in the ultrasound rooms during exams. Our front office staff cannot provide observation of children in our lobby area while testing is being conducted. An adult accompanying a patient to their appointment will only be allowed in the ultrasound room at the discretion of the ultrasound technician under special circumstances. We apologize for any inconvenience. [address]   Your physician has requested that you have an abdominal aorta duplex. During this test, an ultrasound is used to evaluate the aorta. Allow 30 minutes for this exam. Do not eat after midnight the day before and avoid carbonated beverages.  Please note: We ask at that you not bring children with you during ultrasound (echo/ vascular) testing. Due to room size and safety concerns, children are not allowed in the ultrasound rooms during exams. Our front office staff cannot provide observation of children in our lobby area while testing is being conducted. An adult accompanying a patient to their appointment will only be allowed in the  ultrasound room at the discretion of the ultrasound technician under special circumstances. We apologize for any inconvenience. [address]     Your cardiac CT will be scheduled at  [facility] [address]  [place], [place] [address]    If scheduled at the Heart and Vascular Tower at [address], please enter the parking lot using the [address] entrance and use the FREE valet service at the patient drop-off area. Enter the building and check-in with registration on the main floor.  Please follow these instructions carefully (unless otherwise directed):  An IV will be required for this test and Nitroglycerin  will be given.    On the Night Before the Test: Be sure to Drink plenty of water. Do not consume any caffeinated/decaffeinated beverages or chocolate 12 hours prior to your test. Do not take any antihistamines 12 hours prior to your test.  On the Day of the Test: Drink plenty of water until 1 hour prior to the test. Do not eat any food 1 hour prior to test. You may take your regular medications prior to the test.  Take metoprolol  (Lopressor ) 100 MG two hours prior to test. FEMALES- please wear underwire-free bra if available, avoid dresses & tight clothing      After the Test: Drink plenty of water. After receiving IV contrast, you may experience a mild flushed feeling. This is normal. On occasion, you may experience a mild rash up to 24 hours after the test. This is not dangerous. If this occurs, you can take Benadryl 25 mg, Zyrtec, Claritin, or Allegra and increase your fluid intake. (Patients taking Tikosyn should avoid Benadryl,  and may take Zyrtec, Claritin, or Allegra) If you experience trouble breathing, this can be serious. If it is severe call 911 IMMEDIATELY. If it is mild, please call our office.  We will call to schedule your test 2-4 weeks out understanding that some insurance companies will need an authorization prior  to the service being performed.   For more information and frequently asked questions, please visit our website : [email or web address]  For non-scheduling related questions, please contact the cardiac imaging nurse navigator should you have any questions/concerns: Cardiac Imaging Nurse Navigators Direct Office Dial: [phone]   For scheduling needs, including cancellations and rescheduling, please call [place], [phone].   Follow-Up: At [facility], you and your health needs are our priority.  As part of our continuing mission to provide you with exceptional heart care, our providers are all part of one team.  This team includes your primary Cardiologist (physician) and Advanced Practice Providers or APPs (Physician Assistants and Nurse Practitioners) who all work together to provide you with the care you need, when you need it.  Your next appointment:   6 month(s)  Provider:   REDELL SHALLOW MD

## 2023-11-11 ENCOUNTER — Encounter (HOSPITAL_COMMUNITY): Payer: Self-pay

## 2023-11-12 ENCOUNTER — Telehealth (HOSPITAL_COMMUNITY): Payer: Self-pay | Admitting: *Deleted

## 2023-11-12 NOTE — Telephone Encounter (Signed)

## 2023-11-13 ENCOUNTER — Ambulatory Visit (HOSPITAL_COMMUNITY)
Admission: RE | Admit: 2023-11-13 | Discharge: 2023-11-13 | Disposition: A | Discharge status: Home / Self Care | Source: Ambulatory Visit | Attending: Cardiology | Admitting: Cardiology

## 2023-11-13 ENCOUNTER — Ambulatory Visit (HOSPITAL_BASED_OUTPATIENT_CLINIC_OR_DEPARTMENT_OTHER)
Admission: RE | Admit: 2023-11-13 | Discharge: 2023-11-13 | Disposition: A | Discharge status: Home / Self Care | Source: Ambulatory Visit | Attending: Cardiology | Admitting: Cardiology

## 2023-11-13 ENCOUNTER — Ambulatory Visit: Payer: Self-pay | Admitting: Cardiology

## 2023-11-13 DIAGNOSIS — R0989 Other specified symptoms and signs involving the circulatory and respiratory systems: Secondary | ICD-10-CM | POA: Diagnosis present

## 2023-11-13 DIAGNOSIS — R072 Precordial pain: Secondary | ICD-10-CM | POA: Diagnosis present

## 2023-11-13 DIAGNOSIS — R7989 Other specified abnormal findings of blood chemistry: Secondary | ICD-10-CM | POA: Diagnosis present

## 2023-11-13 DIAGNOSIS — Z720 Tobacco use: Secondary | ICD-10-CM | POA: Insufficient documentation

## 2023-11-13 DIAGNOSIS — I471 Supraventricular tachycardia, unspecified: Secondary | ICD-10-CM | POA: Insufficient documentation

## 2023-11-13 LAB — ECHOCARDIOGRAM COMPLETE
AR max vel: 1.79 cm2
AV Area VTI: 1.78 cm2
AV Area mean vel: 1.72 cm2
AV Mean grad: 3 mmHg
AV Peak grad: 5 mmHg
Ao pk vel: 1.12 m/s
Area-P 1/2: 3.08 cm2
S' Lateral: 2.88 cm

## 2023-11-13 MED ORDER — NITROGLYCERIN 0.4 MG SL SUBL
0.8000 mg | SUBLINGUAL_TABLET | Freq: Once | SUBLINGUAL | Status: AC
  Administered 2023-11-13: 0.8 mg via SUBLINGUAL

## 2023-11-13 MED ORDER — IOHEXOL 350 MG/ML SOLN
100.0000 mL | Freq: Once | INTRAVENOUS | Status: AC | PRN
  Administered 2023-11-13: 100 mL via INTRAVENOUS

## 2023-11-18 ENCOUNTER — Encounter: Payer: Self-pay | Admitting: Cardiology

## 2023-11-22 ENCOUNTER — Telehealth: Payer: Self-pay | Admitting: Physician Assistant

## 2023-11-22 NOTE — Telephone Encounter (Signed)
 Patient paged after-hours answering service due to recurrent palpitation last night.  The symptom only lasted 2 to 3 minutes.  She has a history of SVT and was recently switched from as needed dose of metoprolol  to tartrate to scheduled metoprolol  succinate 25 mg daily.  She is quite anxious.  I recommend increase metoprolol  succinate to 25 mg twice a day to give her better coverage.  I also discussed carotid massage and Valsalva maneuver.  She has upcoming visit with Dr. Almetta to discuss possibility of ablation.

## 2023-11-23 NOTE — Progress Notes (Signed)
 Office Visit:  Date of Service: 11/23/2023  Chief Complaint:  Jaime Rich is a 58 y.o. female who presents for Chief Complaint  Patient presents with  . Establish Care  .  HPI:   Establish care Patient is here to establish care  Patient takes metoprolol  25 mg daily she takes this for heart rate.  Saturday she started having racing heart.  She did call cardiology and per [facility] record she was instructed the information below. She has not increased it to the 2 a day yet she wanted to talk to you.  The symptom only lasted 2 to 3 minutes. She has a history of SVT and was recently switched from as needed dose of metoprolol  to tartrate to scheduled metoprolol  succinate 25 mg daily. She is quite anxious. I recommend increase metoprolol  succinate to 25 mg twice a day to give her better coverage. I also discussed carotid massage and Valsalva maneuver. She has upcoming visit with Jaime Rich to discuss possibility of ablation.   Recommended for ablation. Metoprolol  started.  Having Panic attacks recently, first one started after first incident of psvt.   Subjective: Jaime Rich is a healthy 58 y.o. female presents for a yearly CPE. Chief Complaint  Patient presents with  . Establish Care   Preventative Health:  SCREENING TESTS: --Last colonoscopy: never  --Last mammogram: -14 years ago.  LIFESTYLE: --Adequate sleep: no, especially not recently --Hours exercise per week: walking --Smoker: 1/2 ppd - down to 1/4 pack --Alcohol: occ --Seatbeat use: y  --OTC vitamins: centrum for women  Other HPI:  * PSVT Has had covid twice and booster couple of times. Followed by cardiology.  GERD Takes omeprazole daily since 2019 Has some gastritis on and off despite omeprazole.    LABS/IMMUNIZATIONS: --Last lipid profile:@LASTLIPIDPROFILE @ --Last immunizations:  Most Recent Immunizations  Administered Date(s) Administered  . Moderna Covid-19, mRNA,LNP-S,PF 12+ Yrs  02/02/2022  . Pfizer SARS-CoV-2 Primary Series 12+ yrs 03/05/2020  . TDAP VACCINE (BOOSTRIX,ADACEL) 7Y+ 07/03/2023   --Pneumococcus utd:no --Shingrix: no   Current Medications[1]  Histories Medical History[2] Surgical History[3] Family History[4] Social History[5]  The following portions of the patient's history were reviewed and updated as appropriate: allergies, current medications, past family history, past medical history, past social history, past surgical history and problem list.  ROS: Review of Systems  Vitals:   11/23/23 1506  BP: 120/70  Pulse: 75  SpO2: 99%  Weight: 63.5 kg (140 lb)  Height: 1.753 m (5' 9)    Physical Exam: GENERAL APPEARANCE:  Well developed, well groomed. No acute distress.  MENTAL STATUS: Appears alert and oriented. Affect appropriate.  CHEST: Respirations unlabored with normal diaphragmatic excursion. Chest wall symmetric with no masses. Breath sounds clear bilaterally. Heart RRR, no murmurs. ABDOMEN: Abdomen soft with normal bowel sounds. No guarding or rebound. No palpable masses or tenderness. EXTREMITIES: No edema. Distal pulses full and symmetrical. NEUROLOGICAL: No focal motor/sensory deficits. DTR's intact.   Results  No results found for any previous visit.     Behavioral Health Screening  Patient Health Questionnaire-2 Score: 0 (11/23/2023  3:08 PM)      Patient's Depression screening is Negative   Depression Plan: Normal/Negative Screening  Assessment/Plan:  1. Annual physical exam (Primary) - Comprehensive Metabolic Panel; Future - Lipid Panel; Future - TSH; Future - CBC with Differential; Future - Vitamin D, 25-Hydroxy; Future - Vitamin B12; Future  2. Hyperlipidemia, mixed   3. Other acute gastritis without hemorrhage Double omeprazole to 2 daily for 2-3  weeks   4. Panic attacks Start zoloft 50 mg daily, may start 1/2 tablet daily Take lorazepam as needed for panic attacks.   5. Breast screening - MG  Breast Screening Tomo Bilat; Future  6. Encounter for screening for malignant neoplasm of colon - Ambulatory referral to Gastroenterology; Future   Return in about 4 weeks (around 12/21/2023) for anxiety/ palpitations.  Jaime Rich  11/23/2023 4:11 PM  This document serves as a record of services personally performed by Jaime Rich. Witten MD.  It was created on their behalf by Jaime Rich, CMA, a trained medical scribe, and Certified Medical Assistant (CMA). During the course of documenting the history, physical exam and medical decision making, I was functioning as a Stage manager. The creation of this record is the provider's dictation and/or activities during the visit.  Electronically signed by: Jaime Rich, CMA 11/23/2023 3:03 PM   This document was reviewed and edited by me and is correct.       [1] Current Outpatient Medications  Medication Sig Dispense Refill  . metoprolol  succinate (TOPROL  XL) 25 mg 24 hr tablet Take 25 mg by mouth daily.    Jaime Rich omeprazole (PriLOSEC) 10 mg DR capsule Take 10 mg by mouth daily.     Current Facility-Administered Medications  Medication Dose Route Frequency Provider Last Rate Last Admin  . lidocaine (XYLOCAINE) 10 mg/mL (1 %) injection 30 mg  3 mL infiltration Once Jaime Rich      [2] Past Medical History: Diagnosis Date  . Allergic    Seasonal allergies  . Anxiety Aug 2025   Very high anxiety since heart issues  . GERD (gastroesophageal reflux disease)   . Varicella 1966   Had as a child  [3] Past Surgical History: Procedure Laterality Date  . BREAST SURGERY Bilateral 05/2019   Augmentation  . TISSUE EXPANDER REMOVAL Right 09/02/2023   Tissue rearrangement of the lip performed by Jaime Merilee Samples, MD at [facility] OR  . TUBAL LIGATION  1987  [4] Family History Problem Relation Name Age of Onset  . Hypertension Mother Jaime Rich   . Heart disease Mother Jaime Rich   . Cancer Mother Jaime Rich 19  . Diabetes Brother     . Thyroid disease Maternal Aunt Jaime Rich   . Hypertension Maternal Aunt Jaime Rich   . Arthritis Maternal Aunt Jaime Rich   . Hypertension Maternal Aunt    . Heart disease Maternal Grandmother    [5] Social History Socioeconomic History  . Marital status: Single  Tobacco Use  . Smoking status: Some Days    Current packs/day: 0.50    Average packs/day: 0.5 packs/day for 10.7 years (5.3 ttl pk-yrs)    Types: Cigarettes    Start date: 03/17/2013  . Smokeless tobacco: Never  Vaping Use  . Vaping status: Never Used  Substance and Sexual Activity  . Alcohol use: Not Currently  . Drug use: Never  . Sexual activity: Not Currently    Partners: Male    Birth control/protection: None   Social Drivers of Health   Food Insecurity: Low Risk  (11/21/2023)   Food vital sign   . Within the past 12 months, you worried that your food would run out before you got money to buy more: Never true   . Within the past 12 months, the food you bought just didn't last and you didn't have money to get more: Never true  Transportation Needs: No Transportation Needs (11/21/2023)   Transportation   .  In the past 12 months, has lack of reliable transportation kept you from medical appointments, meetings, work or from getting things needed for daily living? : No  Safety: Low Risk  (09/09/2023)   Safety   . How often does anyone, including family and friends, physically hurt you?: Never   . How often does anyone, including family and friends, insult or talk down to you?: Never   . How often does anyone, including family and friends, threaten you with harm?: Never   . How often does anyone, including family and friends, scream or curse at you?: Never  Living Situation: Low Risk  (11/21/2023)   Living Situation   . What is your living situation today?: I have a steady place to live   . Think about the place you live. Do you have problems with any of the following? Choose all that apply:: None/None on this  list

## 2023-12-06 NOTE — Progress Notes (Unsigned)
 Cardiology Office Note   Date:  12/07/2023  ID:  Jaime, Rich 1966-02-21, MRN [id number] PCP: Jaime [name], MD  [facility] Providers Cardiologist:  Jaime Shallow, MD Electrophysiologist:  Jaime DELENA Primus, MD   History of Present Illness Jaime Rich is a 58 y.o. female with SVT with associated chest tightness and dyspnea, tobacco use, and abdominal bruit who presents for further evaluation of SVT.  I met Jaime Rich on 10/25/2023 when she was evaluated for chest pain and elevated hsT in the [facility] ED. she had reported sudden onset central chest tightness/pressure that lasted for 40 minutes duration and was associated with tachycardia with rates in the 160s with abrupt resolution of symptoms and normalization of her heart rate.  On EMS arrival she was still tachycardic and was given aspirin and SL NG.  She was still tachycardic to 130-140 on EMS arrival and symptomatic.  It was and route to the hospital that she had abrupt resolution of her symptoms and she remained in sinus tach in the low 100s on my evaluation then.  That was the third time she had had SVT in the prior 2 years with the most recent episode around 8 months prior.  The previous episodes that only lasted around 2 minutes before terminating without any intervention.  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  At that time she did not want to be on long-acting medications but was okay taking an as needed medication if her SVT persisted.  I had prescribed metoprolol  tartrate 50 mg as needed and she was referred for ongoing management.  She saw Dr. Shallow on 10/29/2023 and he had ordered echo to reassess LV function along with cCTA to r/o any obstructive SVT. There was an abdominal bruit on exam so abdominal US  was also ordered. Her duaghter died of a  ruptured aneurysm at 59 yo. cCTA was reassuring with CAC 3.05 with minimal soft plaque in the mLAD.   She called after hours on 11/22/2023 with recurrent palpitations that lasted for 2-3 minutes she had recently been switched from the metoprolol  to tartrate as needed to Toprol -XL 25 mg daily. Jaime Rich had recommended increasing her metoprolol  to Toprol -XL 25 mg twice daily.   Today she presents back to clinic and has no recurrent significant SVT although she has had some episodes of heart rate in the 90-100 range for which she feels that she is symptomatic.  She did have 1 episode which she thought was in the 130s however all of these were different than the presentation which brought her to the emergency department.  She admits that some of this may be anxiety over her previous admission evaluation.  She is currently on Toprol  XL 25 mg twice daily for arrhythmia control.  ROS: palpitations  Studies Reviewed  TTE Result date: 11/13/23 1. Left ventricular ejection fraction, by estimation, is 60 to 65%. The left ventricle has normal function. The left ventricle has no regional wall motion abnormalities. Left ventricular diastolic parameters are consistent with Grade I diastolic dysfunction (impaired relaxation). 2. Right ventricular systolic function is normal. The right ventricular size is normal. There is normal pulmonary artery systolic pressure. 3. The mitral valve is normal in structure. No evidence of mitral valve regurgitation. No evidence of mitral stenosis. 4. The aortic valve is tricuspid. Aortic valve  regurgitation is not visualized. No aortic stenosis is present. 5. The inferior vena cava is normal in size with greater than 50% respiratory variability, suggesting right atrial pressure of 3 mmHg.  cCTA Result date: 11/13/23 1. Coronary calcium score of 3.05. This was 68th percentile for age-, sex, and race-matched controls. 2. Total plaque volume 28 mm3 which is 28th percentile for  age- and sex-matched controls (calcified plaque 33mm3; non-calcified plaque 51mm3). TPV is mild. 3. Normal coronary origin with right dominance. 4. Minimal Soft plaque <25% mid LAD. 5. Consider non atherosclerotic causes of chest pain.  Physical Exam VS:  BP (!) 154/84   Pulse 61   Ht 5' 9 (1.753 m)   Wt 136 lb 1.6 oz (61.7 kg)   SpO2 94%   BMI 20.10 kg/m       Wt Readings from Last 3 Encounters:  12/07/23 136 lb 1.6 oz (61.7 kg)  10/29/23 140 lb (63.5 kg)  10/25/23 139 lb (63 kg)    GEN: Well nourished, well developed in no acute distress CARDIAC: RRR, no murmurs, rubs, gallops RESPIRATORY:  Clear to auscultation without rales, wheezing or rhonchi  ABDOMEN: Soft, non-tender, non-distended EXTREMITIES:  No edema; No deformity   ASSESSMENT AND PLAN Jaime Rich is a 58 y.o. female with SVT with associated chest tightness and dyspnea, tobacco use, and abdominal bruit who presents for further evaluation of SVT.  SVT Palpitations  She presents for evaluation of SVT that required EMS evaluation and spontaneously converted and route to the hospital.  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  This was not captured on ECG.  She has not had documented recurrence of this since that time.  With her reports of similar but not as severe symptoms with heart rates from 100-130 we will check a 2-week Holter monitor to make sure that she is not having a different arrhythmia that is causing her symptoms.  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  We reviewed the risk, benefits, and alternatives to EP study and possible ablation.  She understands that there is a small possible risk of injury to her intrinsic conduction system as a complication resulting in the need for PPM.  She also understands other associated risk  such as site bleeding, potential damage or perforation to the heart wall requiring drain or surgery.  She would preference being off all medications to control her arrhythmias and would like to proceed with EP study and possible ablation.  Pre procedure  Hold toprol  XL 5 days prior  Schedule for 01/13/24 for EPS+ablation, Carto/anesthesia  Dispo: RTC post ablation   Signed, Jaime DELENA Primus, MD

## 2023-12-07 ENCOUNTER — Ambulatory Visit

## 2023-12-07 ENCOUNTER — Encounter: Payer: Self-pay | Admitting: Student in an Organized Health Care Education/Training Program

## 2023-12-07 ENCOUNTER — Ambulatory Visit
Attending: Student in an Organized Health Care Education/Training Program | Admitting: Student in an Organized Health Care Education/Training Program

## 2023-12-07 VITALS — BP 154/84 | HR 61 | Ht 69.0 in | Wt 136.1 lb

## 2023-12-07 DIAGNOSIS — Z01812 Encounter for preprocedural laboratory examination: Secondary | ICD-10-CM | POA: Diagnosis not present

## 2023-12-07 DIAGNOSIS — R000 Tachycardia, unspecified: Secondary | ICD-10-CM

## 2023-12-07 DIAGNOSIS — I471 Supraventricular tachycardia, unspecified: Secondary | ICD-10-CM | POA: Diagnosis not present

## 2023-12-07 NOTE — Patient Instructions (Signed)
 Medication Instructions:  Your physician recommends that you continue on your current medications as directed. Please refer to the Current Medication list given to you today.   *If you need a refill on your cardiac medications before your next appointment, please call your pharmacy*  Lab Work: CBC and BMET - please have pre-procedure lab work completed on Monday, 12/28/2023 . This can be done at ANY [facility] near you - no appointment required and this does not have to be fasting. If you have labs (blood work) drawn today and your tests are completely normal, you will receive your results only by: MyChart Message (if you have MyChart) OR A paper copy in the mail If you have any lab test that is abnormal or we need to change your treatment, we will call you to review the results.  Testing/Procedures: SVT Ablation - scheduled on Wednesday,01/13/2024.  We will be in contact closer to your ablation date with further instructions Your physician has recommended that you have an ablation. Catheter ablation is a medical procedure used to treat some cardiac arrhythmias (irregular heartbeats). During catheter ablation, a long, thin, flexible tube is put into a blood vessel in your groin (upper thigh), or neck. This tube is called an ablation catheter. It is then guided to your heart through the blood vessel. Radio frequency waves destroy small areas of heart tissue where abnormal heartbeats may cause an arrhythmia to start. Please see the instruction sheet given to you today.  Follow-Up: At [facility], you and your health needs are our priority.  As part of our continuing mission to provide you with exceptional heart care, our providers are all part of one team.  This team includes your primary Cardiologist (physician) and Advanced Practice Providers or APPs (Physician Assistants and Nurse Practitioners) who all work together to provide you with the care you need, when you need it.  Your next  appointment:   We will schedule follow up after your ablation  Provider:   Dr Almetta GEOFFRY HEWS- Long Term Monitor Instructions  Your physician has requested you wear a ZIO patch monitor for 14 days.  This is a single patch monitor. [employer] supplies one patch monitor per enrollment. Additional stickers are not available. Please do not apply patch if you will be having a Nuclear Stress Test,  Echocardiogram, Cardiac CT, MRI, or Chest Xray during the period you would be wearing the  monitor. The patch cannot be worn during these tests. You cannot remove and re-apply the  ZIO XT patch monitor.  Your ZIO patch monitor will be mailed 3 day USPS to your address on file. It may take 3-5 days  to receive your monitor after you have been enrolled.  Once you have received your monitor, please review the enclosed instructions. Your monitor  has already been registered assigning a specific monitor serial # to you.  Billing and Patient Assistance Program Information  We have supplied [employer] with any of your insurance information on file for billing purposes. [employer] offers a sliding scale Patient Assistance Program for patients that do not have  insurance, or whose insurance does not completely cover the cost of the ZIO monitor.  You must apply for the Patient Assistance Program to qualify for this discounted rate.  To apply, please call [employer] at [phone], select option 4, select option 2, ask to apply for  Patient Assistance Program. Meredeth will ask your household income, and how many people  are in your household. They will  quote your out-of-pocket cost based on that information.  [employer] will also be able to set up a 63-month, interest-free payment plan if needed.  Applying the monitor   Shave hair from upper left chest.  Hold abrader disc by orange tab. Rub abrader in 40 strokes over the upper left chest as  indicated in your monitor instructions.  Clean area with 4 enclosed  alcohol pads. Let dry.  Apply patch as indicated in monitor instructions. Patch will be placed under collarbone on left  side of chest with arrow pointing upward.  Rub patch adhesive wings for 2 minutes. Remove white label marked 1. Remove the white  label marked 2. Rub patch adhesive wings for 2 additional minutes.  While looking in a mirror, press and release button in center of patch. A small green light will  flash 3-4 times. This will be your only indicator that the monitor has been turned on.  Do not shower for the first 24 hours. You may shower after the first 24 hours.  Press the button if you feel a symptom. You will hear a small click. Record Date, Time and  Symptom in the Patient Logbook.  When you are ready to remove the patch, follow instructions on the last 2 pages of Patient  Logbook. Stick patch monitor onto the last page of Patient Logbook.  Place Patient Logbook in the blue and white box. Use locking tab on box and tape box closed  securely. The blue and white box has prepaid postage on it. Please place it in the mailbox as  soon as possible. Your physician should have your test results approximately 7 days after the  monitor has been mailed back to [facility].  Call [facility] Customer Care at [phone] if you have questions regarding  your ZIO XT patch monitor. Call them immediately if you see an orange light blinking on your  monitor.  If your monitor falls off in less than 4 days, contact our Monitor department at [phone].  If your monitor becomes loose or falls off after 4 days call [employer] at [phone] for  suggestions on securing your monitor    Cardiac Ablation Cardiac ablation is a procedure to destroy, or ablate, a small amount of heart tissue that is causing problems. The heart has many electrical connections. Sometimes, these connections are abnormal and can cause the heart to beat very fast or irregularly. Ablating the abnormal areas  can improve the heart's rhythm or return it to normal. Ablation may be done for people who: Have irregular or rapid heartbeats (arrhythmias). Have Wolff-Parkinson-White syndrome. Have taken medicines for an arrhythmia that did not work or caused side effects. Have a high-risk heartbeat that may be life-threatening. Tell a health care provider about: Any allergies you have. All medicines you are taking, including vitamins, herbs, eye drops, creams, and over-the-counter medicines. Any problems you or family members have had with anesthesia. Any bleeding problems you have. Any surgeries you have had. Any medical conditions you have. Whether you are pregnant or may be pregnant. What are the risks? Your health care provider will talk with you about risks. These may include: Infection. Bruising and bleeding. Stroke or blood clots. Damage to nearby structures or organs. Allergic reaction to medicines or dyes. Needing a pacemaker if the heart gets damaged. A pacemaker is a device that helps the heart beat normally. Failure of the procedure. A repeat procedure may be needed. What happens before the procedure? Medicines Ask your health care provider about:  Changing or stopping your regular medicines. These include any heart rhythm medicines, diabetes medicines, or blood thinners you take. Taking medicines such as aspirin and ibuprofen. These medicines can thin your blood. Do not take them unless your health care provider tells you to. Taking over-the-counter medicines, vitamins, herbs, and supplements. General instructions Follow instructions from your health care provider about what you may eat and drink. If you will be going home right after the procedure, plan to have a responsible adult: Take you home from the hospital or clinic. You will not be allowed to drive. Care for you for the time you are told. Ask your health care provider what steps will be taken to prevent infection. What  happens during the procedure?  An IV will be inserted into one of your veins. You may be given: A sedative. This helps you relax. Anesthesia. This will: Numb certain areas of your body. An incision will be made in your neck or your groin. A needle will be inserted through the incision and into a large vein in your neck or groin. The small, thin tube (catheter) will be inserted through the needle and moved to your heart. A type of X-ray (fluoroscopy) will be used to help guide the catheter and provide images of the heart on a monitor. Dye may be injected through the catheter to help your surgeon see the area of the heart that needs treatment. Electrical currents will be sent from the catheter to destroy heart tissue in certain areas. There are three types of energy that may be used to do this: Heat (radiofrequency energy). Laser energy. Extreme cold (cryoablation). When the tissue has been destroyed, the catheter will be removed. Pressure will be held on the insertion area to prevent bleeding. A bandage (dressing) will be placed over the insertion area. The procedure may vary among health care providers and hospitals. What happens after the procedure? Your blood pressure, heart rate and rhythm, breathing rate, and blood oxygen level will be monitored until you leave the hospital or clinic. Your insertion area will be checked for bleeding. You will need to lie still for a few hours. If your groin was used, you will need to keep your leg straight for a few hours after the catheter is removed. This information is not intended to replace advice given to you by your health care provider. Make sure you discuss any questions you have with your health care provider. Document Revised: 08/20/2021 Document Reviewed: 08/20/2021 Elsevier Patient Education  2024 [employer].

## 2023-12-07 NOTE — Progress Notes (Unsigned)
 Enrolled for [employer] to mail a ZIO XT long term holter monitor to the patients address on file.

## 2023-12-10 ENCOUNTER — Telehealth: Payer: Self-pay

## 2023-12-10 NOTE — Telephone Encounter (Signed)
-----   Message from Nurse Aldona R sent at 12/08/2023  5:14 PM EDT ----- Regarding: 10/29 SVT Almetta Important: list procedure date as first item in subject line, followed by procedure type (e.g., 11/27/23 AFib ablation)  Precert:  MD: Almetta Type of ablation: SVT Diagnosis: SVT CPT code: SVT/WPW (06346) Ablation scheduled (date/time): 10/29 830am  Procedure:  Added to calendar? Yes Orders entered? No, >30 days before procedure Letter complete? No, >30 days before procedure Scheduled with cath lab? Yes Any medications to hold? Yes (please list hold instructions): Metoprolol  Labs ordered (CBC, BMET, PT/INR if on warfarin): Yes Mapping system: CARTO (lab 4 or 6) CARTO/OPAL rep notified? Yes Cardiac CT needed? No Dye allergy? N/A Pre-meds ordered and instructions given? N/A Letter method: MyChart H&P: 9/22 Device: No  Follow-up:  Cassie/Angel, please schedule Routine.  Covering RN - please send this message to [employer], EP scheduler, EP Scheduling pool, EP [employer], and CT scheduler ([place] Lynch/Stephanie Mogg), if indicated.

## 2023-12-10 NOTE — Telephone Encounter (Signed)
Work up complete. 

## 2023-12-23 ENCOUNTER — Telehealth (HOSPITAL_COMMUNITY): Payer: Self-pay

## 2023-12-23 ENCOUNTER — Encounter (HOSPITAL_COMMUNITY): Payer: Self-pay

## 2023-12-23 NOTE — Telephone Encounter (Signed)
 Attempted to reach patient to discuss upcoming procedure, no answer. Left VM for patient to return call.

## 2024-01-08 ENCOUNTER — Telehealth: Payer: Self-pay | Admitting: Student in an Organized Health Care Education/Training Program

## 2024-01-08 NOTE — Telephone Encounter (Signed)
 Spoke with pt regarding labs. Pt stated she plans to have pre-procedure labs drawn today (CBC, BMET). Pt stated she saw on MyChart that she was supposed to have them drawn between the 1st and 15th and is hoping that getting labs today won't be a problem. Pt was told that she should get labs today and that Dr. Almetta would be notified. Pt verbalized understanding. All questions if any were answered.

## 2024-01-08 NOTE — Telephone Encounter (Signed)
 Patient called to report she is having her lab work done today.

## 2024-01-09 LAB — BASIC METABOLIC PANEL WITH GFR
BUN/Creatinine Ratio: 18 (ref 9–23)
BUN: 15 mg/dL (ref 6–24)
CO2: 21 mmol/L (ref 20–29)
Calcium: 9.1 mg/dL (ref 8.7–10.2)
Chloride: 100 mmol/L (ref 96–106)
Creatinine, Ser: 0.83 mg/dL (ref 0.57–1.00)
Glucose: 90 mg/dL (ref 70–99)
Potassium: 3.8 mmol/L (ref 3.5–5.2)
Sodium: 137 mmol/L (ref 134–144)
eGFR: 81 mL/min/1.73 (ref >59)

## 2024-01-09 LAB — CBC
Hematocrit: 41.8 % (ref 34.0–46.6)
Hemoglobin: 14.1 g/dL (ref 11.1–15.9)
MCH: 33.8 pg — ABNORMAL HIGH (ref 26.6–33.0)
MCHC: 33.7 g/dL (ref 31.5–35.7)
MCV: 100 fL — ABNORMAL HIGH (ref 79–97)
Platelets: 221 x10E3/uL (ref 150–450)
RBC: 4.17 x10E6/uL (ref 3.77–5.28)
RDW: 12.4 % (ref 11.7–15.4)
WBC: 5.8 x10E3/uL (ref 3.4–10.8)

## 2024-01-12 NOTE — Pre-Procedure Instructions (Signed)
 Instructed patient on the following items: Arrival time 0515 Nothing to eat or drink after midnight No meds AM of procedure Responsible person to drive you home and stay with you for 24 hrs  Last metoprolol - 10/23

## 2024-01-13 ENCOUNTER — Encounter (HOSPITAL_COMMUNITY)
Admission: RE | Disposition: A | Discharge status: Home / Self Care | Payer: Self-pay | Source: Home / Self Care | Attending: Student in an Organized Health Care Education/Training Program

## 2024-01-13 ENCOUNTER — Ambulatory Visit (HOSPITAL_COMMUNITY)
Admission: RE | Admit: 2024-01-13 | Discharge: 2024-01-13 | Disposition: A | Discharge status: Home / Self Care | Attending: Student in an Organized Health Care Education/Training Program | Admitting: Student in an Organized Health Care Education/Training Program

## 2024-01-13 ENCOUNTER — Ambulatory Visit (HOSPITAL_COMMUNITY): Admitting: Certified Registered"

## 2024-01-13 ENCOUNTER — Other Ambulatory Visit: Payer: Self-pay

## 2024-01-13 ENCOUNTER — Encounter (HOSPITAL_COMMUNITY): Payer: Self-pay | Admitting: Student in an Organized Health Care Education/Training Program

## 2024-01-13 DIAGNOSIS — I471 Supraventricular tachycardia, unspecified: Secondary | ICD-10-CM

## 2024-01-13 DIAGNOSIS — Z79899 Other long term (current) drug therapy: Secondary | ICD-10-CM | POA: Insufficient documentation

## 2024-01-13 DIAGNOSIS — Z720 Tobacco use: Secondary | ICD-10-CM | POA: Insufficient documentation

## 2024-01-13 DIAGNOSIS — R002 Palpitations: Secondary | ICD-10-CM | POA: Diagnosis not present

## 2024-01-13 DIAGNOSIS — I4719 Other supraventricular tachycardia: Secondary | ICD-10-CM | POA: Insufficient documentation

## 2024-01-13 HISTORY — PX: SVT ABLATION: EP1225

## 2024-01-13 SURGERY — SVT ABLATION
Anesthesia: Monitor Anesthesia Care

## 2024-01-13 MED ORDER — FENTANYL CITRATE (PF) 100 MCG/2ML IJ SOLN
INTRAMUSCULAR | Status: AC
  Filled 2024-01-13: qty 2

## 2024-01-13 MED ORDER — MIDAZOLAM HCL 2 MG/2ML IJ SOLN
INTRAMUSCULAR | Status: AC
  Filled 2024-01-13: qty 2

## 2024-01-13 MED ORDER — HEPARIN SODIUM (PORCINE) 1000 UNIT/ML IJ SOLN
INTRAMUSCULAR | Status: DC | PRN
  Administered 2024-01-13: 1000 [IU] via INTRAVENOUS

## 2024-01-13 MED ORDER — FENTANYL CITRATE (PF) 100 MCG/2ML IJ SOLN
25.0000 ug | INTRAMUSCULAR | Status: DC | PRN
  Administered 2024-01-13: 25 ug via INTRAVENOUS

## 2024-01-13 MED ORDER — MIDAZOLAM HCL (PF) 2 MG/2ML IJ SOLN
INTRAMUSCULAR | Status: DC | PRN
  Administered 2024-01-13 (×2): 2 mg via INTRAVENOUS

## 2024-01-13 MED ORDER — ISOPROTERENOL HCL 0.2 MG/ML IJ SOLN
INTRAMUSCULAR | Status: DC | PRN
  Administered 2024-01-13: 1 ug/min via INTRAVENOUS

## 2024-01-13 MED ORDER — FENTANYL CITRATE (PF) 100 MCG/2ML IJ SOLN
INTRAMUSCULAR | Status: DC | PRN
  Administered 2024-01-13 (×2): 50 ug via INTRAVENOUS
  Administered 2024-01-13: 25 ug via INTRAVENOUS
  Administered 2024-01-13: 100 ug via INTRAVENOUS
  Administered 2024-01-13: 25 ug via INTRAVENOUS
  Administered 2024-01-13: 100 ug via INTRAVENOUS
  Administered 2024-01-13: 50 ug via INTRAVENOUS

## 2024-01-13 MED ORDER — SODIUM CHLORIDE 0.9% FLUSH: 3.0000 mL | Freq: Two times a day (BID) | INTRAVENOUS | Status: DC

## 2024-01-13 MED ORDER — SODIUM CHLORIDE 0.9% FLUSH: 3.0000 mL | INTRAVENOUS | Status: DC | PRN

## 2024-01-13 MED ORDER — SODIUM CHLORIDE 0.9 % IV SOLN
250.0000 mL | INTRAVENOUS | Status: DC | PRN
Start: 2024-01-13 — End: 2024-01-13

## 2024-01-13 MED ORDER — BUPIVACAINE HCL (PF) 0.25 % IJ SOLN
INTRAMUSCULAR | Status: AC
  Filled 2024-01-13: qty 30

## 2024-01-13 MED ORDER — ONDANSETRON HCL 4 MG/2ML IJ SOLN: 4.0000 mg | Freq: Once | INTRAMUSCULAR | Status: DC | PRN

## 2024-01-13 MED ORDER — OXYCODONE HCL 5 MG PO TABS: 5.0000 mg | ORAL_TABLET | Freq: Once | ORAL | Status: DC | PRN

## 2024-01-13 MED ORDER — SODIUM CHLORIDE 0.9 % IV SOLN
INTRAVENOUS | Status: DC | PRN
  Administered 2024-01-13: 1 ug/min via INTRAVENOUS

## 2024-01-13 MED ORDER — BUPIVACAINE HCL (PF) 0.25 % IJ SOLN
INTRAMUSCULAR | Status: DC | PRN
  Administered 2024-01-13: 20 mL

## 2024-01-13 MED ORDER — OXYCODONE HCL 5 MG/5ML PO SOLN: 5.0000 mg | Freq: Once | ORAL | Status: DC | PRN

## 2024-01-13 MED ORDER — HEPARIN (PORCINE) IN NACL 1000-0.9 UT/500ML-% IV SOLN
INTRAVENOUS | Status: DC | PRN
  Administered 2024-01-13: 500 mL

## 2024-01-13 MED ORDER — ACETAMINOPHEN 10 MG/ML IV SOLN: 1000.0000 mg | Freq: Once | INTRAVENOUS | Status: DC | PRN

## 2024-01-13 MED ORDER — ACETAMINOPHEN 325 MG PO TABS: 650.0000 mg | ORAL_TABLET | ORAL | Status: DC | PRN

## 2024-01-13 MED ORDER — ONDANSETRON HCL 4 MG/2ML IJ SOLN: 4.0000 mg | Freq: Four times a day (QID) | INTRAMUSCULAR | Status: DC | PRN

## 2024-01-13 MED ORDER — ISOPROTERENOL HCL 0.2 MG/ML IJ SOLN: INTRAVENOUS | Status: AC | PRN

## 2024-01-13 MED ORDER — ISOPROTERENOL HCL 0.2 MG/ML IJ SOLN
INTRAMUSCULAR | Status: AC
  Filled 2024-01-13: qty 5

## 2024-01-13 MED ORDER — SODIUM CHLORIDE 0.9 % IV SOLN: INTRAVENOUS | Status: DC

## 2024-01-13 SURGICAL SUPPLY — 15 items
BAG SNAP BAND KOVER 36X36 (MISCELLANEOUS) IMPLANT
CATH CRD2 QUAD 6FR 5 (CATHETERS) IMPLANT
CATH DECANAV D CURVE (CATHETERS) IMPLANT
CATH JOSEPH QUAD ALLRED 6F REP (CATHETERS) IMPLANT
CATH SMTCH THERMOCOOL SF DF (CATHETERS) IMPLANT
CLOSURE PERCLOSE PROSTYLE (Vascular Products) IMPLANT
PACK EP LF (CUSTOM PROCEDURE TRAY) ×1 IMPLANT
PAD DEFIB RADIO PHYSIO CONN (PAD) ×1 IMPLANT
PATCH CARTO3 (PAD) IMPLANT
SHEATH AGILIS NXT 8.5F 71CM (SHEATH) IMPLANT
SHEATH INTRO SL0 8.5F 63 (SHEATH) IMPLANT
SHEATH PINNACLE 6F 10CM (SHEATH) IMPLANT
SHEATH PINNACLE 8F 10CM (SHEATH) IMPLANT
SHEATH PROBE COVER 6X72 (BAG) IMPLANT
TUBING SMART ABLATE COOLFLOW (TUBING) IMPLANT

## 2024-01-13 NOTE — Brief Op Note (Signed)
 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 Comprehensive EP study.  2. Coronary sinus pacing and recording.  3. 3D Mapping of supraventricular tachycardia.  4. Radiofrequency ablation of supraventricular tachycardia.  5. Isuprel infusion for arrhythmia induction and maintenance  INTRODUCTION: Jaime Rich is a 71F with documented SVT.   DESCRIPTION OF PROCEDURE: Informed written consent was obtained and the patient was brought to the Electrophysiology Lab in the fasting state. The patient was adequately sedated with intravenous medication as outlined in the anesthesia report. The patient was prepped and draped in the usual sterile fashion. The inguinal areas were anaesthetized using lidocaine. Using ultraound guidance and modified seldinger technique the right femoral vein was accessed x 3. One 8-French and two 7-French sheaths were placed into the right common femoral vein. A Decanav was advanced to the RA and an electroanatomic map was created of the RA/RV and CS. This catheter was then positioned in the coronary sinus for atrial pacing and recording. Next, a CRD2 was positioned at the His bundle for pacing and recording. A Josephson catheter was then advanced to the RV apex for pacing and recording.   Presenting Measurements: The presenting rhythm was sinus rhythm. The PR interval was 143 ms with a QRS of 73 ms and a QT of 392 ms. The average RR interval was 893 ms. The AH interval was 54 ms and the HV interval was 46 ms. There was no pre-excitation on surface ECG.  EP study:  Ventricular pacing was performed which reveals concentric and decremental VA conduction. The VA WCL was 560 ms and a VERP 600/360 ms.  Decremental atrial pacing revealed a WCL of 370 ms. There was evidence of antegrade dual AV nodal physiology with decremental atrial pacing. Atrial estrastimulus testing was then performed revealing an A-H  jump and typical AV nodal echo beats. AVN ERP was 600/310 ms. Isuprel was then started at 1 mcg/min with an observed effect on the sinus CL. Repeat atrial stim was performed successfully inducing tachycardia. Tachycardia was narrow complex with simultaneous V-A activation. Ventricular overdrive pacing was performed demonstrating a post pacing V-A-V response and a prolonged PPI-TCL (657 ms - 510 ms). The above findings were consistent with a diagnosis of typical AV nodal reentrant tachycardia.  Ablation:  An irrigated ablation catheter was then advanced to the RA through an irrigated sheath. The His bundle and CS os were annotated on the electroanatomic map. Ablation was performed at the inferior/ventricular aspect of the [place] of [place] where a slow pathway potential was observed. With ablation, an accelerated junctional rhythm was observed. During accelerated junction rhythm there was intact VA conduction. Several reinforcing lesions were delivered to this area.  Measurements following ablation:  Following ablation, repeat EP study was performed. With atrial pacing, there was excellent AV nodal function and single echos were observed. With ventricular pacing, VA conduction was still intact. Single echos were observed.  Isuprel was again infused at 2 mcg/min with an adequate acceleration in heart rate response. Double echoes were observed so additional ablation (30 W, 10-15 gm force, F' 500) was applied. Repeat EPS still with single echos. Eventually AVNRT was re-induced with isuprel washout. Additional ablation (superior/lateral to the existing ablation lesions) was applied. Junctional rhythm was observed followed by NSR. EP study again confirmed excellent AV nodal function with single echos. With both atrial and ventricular pacing with single extrastimuli single echos were observed with isuprel 2 mcg and during isuprel  washout. Double echos and sustained arrhythmias were not observed.   Final  intervals were: NSR, PR 145, QRS 68, QT 325, RR 580.   The procedure was considered complete. All catheters were removed from the body. Sheaths were removed and hemostasis achieved with Perclose devices and manual pressure. EBL< 20 ml. There were no early apparent complications.   CONCLUSIONS:  1. Sinus rhythm upon presentation.  2. Typical AVNRT inducible at EP study 3. AV conduction appears nodal and excellent. 4. Successful radiofrequency slow pathway modification  5. No inducible arrhythmias following ablation.  6. No early apparent complications.   [name], MD [facility]  Cardiac Electrophysiology

## 2024-01-13 NOTE — H&P (Signed)
 Date:  01/13/24 ID:  Iviona Hole, DOB 1965/07/02, MRN [id number] PCP: Xenia [name], MD  [facility] Providers Cardiologist:  Redell Shallow, MD Electrophysiologist:  [name], MD    History of Present Illness Jaime Rich is a 58 y.o. female with SVT with associated chest tightness and dyspnea, tobacco use, and abdominal bruit who presents for further evaluation of SVT.   I met Jaime Rich on 10/25/2023 when she was evaluated for chest pain and elevated hsT in the [facility] ED. she had reported sudden onset central chest tightness/pressure that lasted for 40 minutes duration and was associated with tachycardia with rates in the 160s with abrupt resolution of symptoms and normalization of her heart rate.  On EMS arrival she was still tachycardic and was given aspirin and SL NG.  She was still tachycardic to 130-140 on EMS arrival and symptomatic.  It was and route to the hospital that she had abrupt resolution of her symptoms and she remained in sinus tach in the low 100s on my evaluation then.   That was the third time she had had SVT in the prior 2 years with the most recent episode around 8 months prior.  The previous episodes that only lasted around 2 minutes before terminating without any intervention.  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  At that time she did not want to be on long-acting medications but was okay taking an as needed medication if her SVT persisted.  I had prescribed metoprolol  tartrate 50 mg as needed and she was referred for ongoing management.   She saw Dr. Shallow on 10/29/2023 and he had ordered echo to reassess LV function along with cCTA to r/o any obstructive SVT. There was an abdominal bruit on exam so abdominal US  was also ordered. Her duaghter died of a ruptured aneurysm at 59 yo.  cCTA was reassuring with CAC 3.05 with minimal soft plaque in the mLAD.    She called after hours on 11/22/2023 with recurrent palpitations that lasted for 2-3 minutes she had recently been switched from the metoprolol  to tartrate as needed to Toprol -XL 25 mg daily. Jaime Rich had recommended increasing her metoprolol  to Toprol -XL 25 mg twice daily.    Today she presents back to clinic and has no recurrent significant SVT although she has had some episodes of heart rate in the 90-100 range for which she feels that she is symptomatic.  She did have 1 episode which she thought was in the 130s however all of these were different than the presentation which brought her to the emergency department.  She admits that some of this may be anxiety over her previous admission evaluation.  She is currently on Toprol  XL 25 mg twice daily for arrhythmia control.   ROS: palpitations   Studies Reviewed   TTE Result date: 11/13/23 1. Left ventricular ejection fraction, by estimation, is 60 to 65%. The left ventricle has normal function. The left ventricle has no regional wall motion abnormalities. Left ventricular diastolic parameters are consistent with Grade I diastolic dysfunction (impaired relaxation). 2. Right ventricular systolic function is normal. The right ventricular size is normal. There is normal pulmonary artery systolic pressure. 3. The mitral valve is normal in structure. No evidence of mitral valve regurgitation. No evidence of mitral stenosis. 4. The aortic valve is  tricuspid. Aortic valve regurgitation is not visualized. No aortic stenosis is present. 5. The inferior vena cava is normal in size with greater than 50% respiratory variability, suggesting right atrial pressure of 3 mmHg.   cCTA Result date: 11/13/23 1. Coronary calcium score of 3.05. This was 68th percentile for age-, sex, and race-matched controls. 2. Total plaque volume 28 mm3 which is 28th percentile for age- and sex-matched  controls (calcified plaque 9mm3; non-calcified plaque 5mm3). TPV is mild. 3. Normal coronary origin with right dominance. 4. Minimal Soft plaque <25% mid LAD. 5. Consider non atherosclerotic causes of chest pain.   Physical Exam VS:  BP (!) 154/84   Pulse 61   Ht 5' 9 (1.753 m)   Wt 136 lb 1.6 oz (61.7 kg)   SpO2 94%   BMI 20.10 kg/m          Wt Readings from Last 3 Encounters:  12/07/23 136 lb 1.6 oz (61.7 kg)  10/29/23 140 lb (63.5 kg)  10/25/23 139 lb (63 kg)    GEN: Well nourished, well developed in no acute distress CARDIAC: RRR, no murmurs, rubs, gallops RESPIRATORY:  Clear to auscultation without rales, wheezing or rhonchi  ABDOMEN: Soft, non-tender, non-distended EXTREMITIES:  No edema; No deformity    ASSESSMENT AND PLAN Jaime Rich is a 58 y.o. female with SVT with associated chest tightness and dyspnea, tobacco use, and abdominal bruit who presents for further evaluation of SVT.   SVT Palpitations  She presents for evaluation of SVT that required EMS evaluation and spontaneously converted and route to the hospital.  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  This was not captured on ECG.  She has not had documented recurrence of this since that time.  With her reports of similar but not as severe symptoms with heart rates from 100-130 we will check a 2-week Holter monitor to make sure that she is not having a different arrhythmia that is causing her symptoms.  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  We reviewed the risk, benefits, and alternatives to EP study and possible ablation.  She understands that there is a small possible risk of injury to her intrinsic conduction system as a complication resulting in the need for PPM.  She also understands other associated risk such as site  bleeding, potential damage or perforation to the heart wall requiring drain or surgery.  She would preference being off all medications to control her arrhythmias and would like to proceed with EP study and possible ablation.   Signed, [name], MD

## 2024-01-13 NOTE — Discharge Instructions (Signed)
 Femoral Site Care This sheet gives you information about how to care for yourself after your procedure. Your health care provider may also give you more specific instructions. If you have problems or questions, contact your health care provider. What can I expect after the procedure?  After the procedure, it is common to have: Bruising that usually fades within 1-2 weeks. Tenderness at the site. Follow these instructions at home: Wound care Follow instructions from your health care provider about how to take care of your insertion site. Make sure you: Wash your hands with soap and water before you change your bandage (dressing). If soap and water are not available, use hand sanitizer. Remove your dressing as told by your health care provider. In 24 hours Do not take baths, swim, or use a hot tub until your health care provider approves. You may shower 24-48 hours after the procedure or as told by your health care provider. Gently wash the site with plain soap and water. Pat the area dry with a clean towel. Do not rub the site. This may cause bleeding. Do not apply powder or lotion to the site. Keep the site clean and dry. Check your femoral site every day for signs of infection. Check for: Redness, swelling, or pain. Fluid or blood. Warmth. Pus or a bad smell. Activity For the first 2-3 days after your procedure, or as long as directed: Avoid climbing stairs as much as possible. Do not squat. Do not lift anything that is heavier than 10 lb (4.5 kg), or the limit that you are told, until your health care provider says that it is safe. For 5 days Rest as directed. Avoid sitting for a long time without moving. Get up to take short walks every 1-2 hours. Do not drive for 24 hours if you were given a medicine to help you relax (sedative). General instructions Take over-the-counter and prescription medicines only as told by your health care provider. Keep all follow-up visits as told by  your health care provider. This is important. Contact a health care provider if you have: A fever or chills. You have redness, swelling, or pain around your insertion site. Get help right away if: The catheter insertion area swells very fast. You pass out. You suddenly start to sweat or your skin gets clammy. The catheter insertion area is bleeding, and the bleeding does not stop when you hold steady pressure on the area. The area near or just beyond the catheter insertion site becomes pale, cool, tingly, or numb. These symptoms may represent a serious problem that is an emergency. Do not wait to see if the symptoms will go away. Get medical help right away. Call your local emergency services (911 in the [place] Do not drive yourself to the hospital. Summary After the procedure, it is common to have bruising that usually fades within 1-2 weeks. Check your femoral site every day for signs of infection. Do not lift anything that is heavier than 10 lb (4.5 kg), or the limit that you are told, until your health care provider says that it is safe. This information is not intended to replace advice given to you by your health care provider. Make sure you discuss any questions you have with your health care provider. Document Revised: 03/16/2017 Document Reviewed: 03/16/2017 Elsevier Patient Education  2020 [employer]  Cardiac Ablation, Care After  This sheet gives you information about how to care for yourself after your procedure. Your health care provider may also give you  more specific instructions. If you have problems or questions, contact your health care provider. What can I expect after the procedure? After the procedure, it is common to have: Bruising around your puncture site. Tenderness around your puncture site. Skipped heartbeats. If you had an atrial fibrillation ablation, you may have atrial fibrillation during the first several months after your procedure.  Tiredness  (fatigue).  Follow these instructions at home: Puncture site care  Follow instructions from your health care provider about how to take care of your puncture site. Make sure you: If present, leave stitches (sutures), skin glue, or adhesive strips in place. These skin closures may need to stay in place for up to 2 weeks. If adhesive strip edges start to loosen and curl up, you may trim the loose edges. Do not remove adhesive strips completely unless your health care provider tells you to do that. If a large square bandage is present, this may be removed 24 hours after surgery.  Check your puncture site every day for signs of infection. Check for: Redness, swelling, or pain. Fluid or blood. If your puncture site starts to bleed, lie down on your back, apply firm pressure to the area, and contact your health care provider. Warmth. Pus or a bad smell. A pea or marble sized lump/knot at the site is normal and can take up to three months to resolve.  Driving Do not drive for at least 4 days after your procedure or however long your health care provider recommends. (Do not resume driving if you have previously been instructed not to drive for other health reasons.) Do not drive or use heavy machinery while taking prescription pain medicine. Activity Avoid activities that take a lot of effort for at least 7 days after your procedure. Do not lift anything that is heavier than 5 lb (4.5 kg) for one week.  No sexual activity for 1 week.  Return to your normal activities as told by your health care provider. Ask your health care provider what activities are safe for you. General instructions Take over-the-counter and prescription medicines only as told by your health care provider. Do not use any products that contain nicotine or tobacco, such as cigarettes and e-cigarettes. If you need help quitting, ask your health care provider. You may shower after 24 hours, but Do not take baths, swim, or use a hot  tub for 1 week.  Do not drink alcohol for 24 hours after your procedure. Keep all follow-up visits as told by your health care provider. This is important. Contact a health care provider if: You have redness, mild swelling, or pain around your puncture site. You have fluid or blood coming from your puncture site that stops after applying firm pressure to the area. Your puncture site feels warm to the touch. You have pus or a bad smell coming from your puncture site. You have a fever. You have chest pain or discomfort that spreads to your neck, jaw, or arm. You have chest pain that is worse with lying on your back or taking a deep breath. You are sweating a lot. You feel nauseous. You have a fast or irregular heartbeat. You have shortness of breath. You are dizzy or light-headed and feel the need to lie down. You have pain or numbness in the arm or leg closest to your puncture site. Get help right away if: Your puncture site suddenly swells. Your puncture site is bleeding and the bleeding does not stop after applying firm pressure to  the area. These symptoms may represent a serious problem that is an emergency. Do not wait to see if the symptoms will go away. Get medical help right away. Call your local emergency services (911 in the [place] Do not drive yourself to the hospital. Summary After the procedure, it is normal to have bruising and tenderness at the puncture site in your groin, neck, or forearm. Check your puncture site every day for signs of infection. Get help right away if your puncture site is bleeding and the bleeding does not stop after applying firm pressure to the area. This is a medical emergency. This information is not intended to replace advice given to you by your health care provider. Make sure you discuss any questions you have with your health care provider.

## 2024-01-13 NOTE — Progress Notes (Signed)
 Hematoma present on arrival to holding area. Site tender to touch, 8/10 pain per patient. Pressure held for 10 minutes. Hematoma resolved. +2 DP still present. MD notified.

## 2024-01-13 NOTE — Transfer of Care (Signed)
 Immediate Anesthesia Transfer of Care Note  Patient: Jaime Rich  Procedure(s) Performed: SVT ABLATION  Patient Location: Cath Lab  Anesthesia Type:MAC  Level of Consciousness: awake, alert , oriented, and patient cooperative  Airway & Oxygen Therapy: Patient Spontanous Breathing  Post-op Assessment: Report given to RN and Post -op Vital signs reviewed and stable  Post vital signs: Reviewed and stable  Last Vitals:  Vitals Value Taken Time  BP 125/85 01/13/24 1000  Temp 36.5 C 01/13/24 1000  Pulse 75 01/13/24 1000  Resp 14 01/13/24 1000  SpO2 97% 01/13/24 1000    Last Pain:  Vitals:   01/13/24 0555  TempSrc:   PainSc: 0-No pain         Complications: No notable events documented.

## 2024-01-13 NOTE — Anesthesia Postprocedure Evaluation (Signed)
 Anesthesia Post Note  Patient: Jaime Rich  Procedure(s) Performed: SVT ABLATION     Patient location during evaluation: Short Stay Anesthesia Type: MAC Level of consciousness: awake Pain management: pain level controlled Vital Signs Assessment: post-procedure vital signs reviewed and stable Respiratory status: spontaneous breathing Cardiovascular status: blood pressure returned to baseline Postop Assessment: no apparent nausea or vomiting Anesthetic complications: no   There were no known notable events for this encounter.  Last Vitals:  Vitals:   01/13/24 1146 01/13/24 1200  BP: (!) 152/86 132/86  Pulse: 73 74  Resp: 14 15  Temp:    SpO2: 96% 95%    Last Pain:  Vitals:   01/13/24 1102  TempSrc:   PainSc: 5                  Lauraine KATHEE [place]

## 2024-01-13 NOTE — Anesthesia Preprocedure Evaluation (Addendum)
 Anesthesia Evaluation  Patient identified by MRN, date of birth, ID band Patient awake    Reviewed: Allergy & Precautions, NPO status , Patient's Chart, lab work & pertinent test results, reviewed documented beta blocker date and time   Airway Mallampati: I  TM Distance: >3 FB Neck ROM: Full    Dental  (+) Teeth Intact, Dental Advisory Given   Pulmonary neg COPD, neg recent URI, Current Smoker and Patient abstained from smoking.   breath sounds clear to auscultation       Cardiovascular + dysrhythmias Supra Ventricular Tachycardia  Rhythm:Regular Rate:Normal  TTE Result date: 11/13/23 1. Left ventricular ejection fraction, by estimation, is 60 to 65%. The left ventricle has normal function. The left ventricle has no regional wall motion abnormalities. Left ventricular diastolic parameters are consistent with Grade I diastolic dysfunction (impaired relaxation). 2. Right ventricular systolic function is normal. The right ventricular size is normal. There is normal pulmonary artery systolic pressure. 3. The mitral valve is normal in structure. No evidence of mitral valve regurgitation. No evidence of mitral stenosis. 4. The aortic valve is tricuspid. Aortic valve regurgitation is not visualized. No aortic stenosis is present. 5. The inferior vena cava is normal in size with greater than 50% respiratory variability, suggesting right atrial pressure of 3 mmHg.    Neuro/Psych neg Seizures    GI/Hepatic ,GERD  ,,  Endo/Other  neg diabetes    Renal/GU Renal disease     Musculoskeletal   Abdominal   Peds  Hematology   Anesthesia Other Findings   Reproductive/Obstetrics                              Anesthesia Physical Anesthesia Plan  ASA: 2  Anesthesia Plan: MAC   Post-op Pain Management:    Induction: Intravenous  PONV Risk Score and Plan: 2 and Ondansetron, Dexamethasone and Treatment may  vary due to age or medical condition  Airway Management Planned: Simple Face Mask  Additional Equipment:   Intra-op Plan:   Post-operative Plan:   Informed Consent:      Dental advisory given  Plan Discussed with: CRNA and Surgeon  Anesthesia Plan Comments:          Anesthesia Quick Evaluation

## 2024-01-13 NOTE — Interval H&P Note (Signed)
 History and Physical Interval Note:  01/13/2024 7:18 AM  Jaime Rich  has presented today for surgery, with the diagnosis of SVT.  The various methods of treatment have been discussed with the patient and family. After consideration of risks, benefits and other options for treatment, the patient has consented to  Procedure(s): SVT ABLATION (N/A) as a surgical intervention.  The patient's history has been reviewed, patient examined, no change in status, stable for surgery.  I have reviewed the patient's chart and labs.  Questions were answered to the patient's satisfaction.    [name]

## 2024-01-13 NOTE — Progress Notes (Signed)
 Discharge instructions reviewed with patient, and sent home for her friend Jaime Rich who is her 24 hour person. PT ambulated in the hallway was able to void prior to discharge. PT escorted from the unit via wheel chair to personal vehicle.PT tolerated PO intake .

## 2024-01-14 ENCOUNTER — Telehealth (HOSPITAL_COMMUNITY): Payer: Self-pay

## 2024-01-14 ENCOUNTER — Encounter (HOSPITAL_COMMUNITY): Payer: Self-pay | Admitting: Student in an Organized Health Care Education/Training Program

## 2024-01-14 NOTE — Telephone Encounter (Signed)
 Spoke with patient to complete post procedure follow up call.  Patient reports no complications with groin sites.   Instructions reviewed with patient:  Remove large bandage at puncture site after 24 hours. It is normal to have bruising, tenderness, mild swelling, and a pea or marble sized lump/knot at the groin site which can take up to three months to resolve.  Get help right away if you notice sudden swelling at the puncture site.  Check your puncture site every day for signs of infection: fever, redness, swelling, pus drainage, warmth, foul odor or excessive pain. If this occurs, please call [phone], to speak with the RN Navigator. Get help right away if your puncture site is bleeding and the bleeding does not stop after applying firm pressure to the area.  You may continue to have skipped beats during the first several months after your procedure.  You will follow up with the APP 1 month after your procedure.  Activity restrictions reviewed.  Patient verbalized understanding to all instructions provided.

## 2024-01-20 ENCOUNTER — Telehealth: Payer: Self-pay | Admitting: Student in an Organized Health Care Education/Training Program

## 2024-01-20 NOTE — Telephone Encounter (Signed)
 Spoke with the patient who states that earlier this morning her heart rate increased to 138. She stated that she could feel her heart pounding and felt a bit off. She states that is stayed around 120 for about an hour. She laid down on the couch at work and eventually heart rate came below 100. She states that currently her heart rate is 89. She reports feeling okay but has some soreness in her chest. She was previously taking metoprolol  - this was discontinued after her ablation. She still has some on hand. Will check with Dr. Almetta to make him aware and if he has recommendations on prn medication for recurrent episodes.

## 2024-01-20 NOTE — Telephone Encounter (Signed)
 Pt had a procedure on last Wednesday and this morning heart had a funny feeling. HR went up to 135 and it is currently at 90. Pt would like a c/b from a nurse please advise

## 2024-02-16 ENCOUNTER — Ambulatory Visit: Attending: Pulmonary Disease | Admitting: Cardiology

## 2024-02-16 ENCOUNTER — Encounter: Payer: Self-pay | Admitting: Cardiology

## 2024-02-16 VITALS — BP 152/82 | HR 69 | Ht 69.0 in | Wt 143.0 lb

## 2024-02-16 DIAGNOSIS — I471 Supraventricular tachycardia, unspecified: Secondary | ICD-10-CM | POA: Diagnosis not present

## 2024-02-16 DIAGNOSIS — Z79899 Other long term (current) drug therapy: Secondary | ICD-10-CM | POA: Diagnosis not present

## 2024-02-16 DIAGNOSIS — Z5181 Encounter for therapeutic drug level monitoring: Secondary | ICD-10-CM

## 2024-02-16 MED ORDER — FLECAINIDE ACETATE 50 MG PO TABS: 50.0000 mg | ORAL_TABLET | Freq: Two times a day (BID) | ORAL | 3 refills | Status: AC

## 2024-02-16 NOTE — Progress Notes (Signed)
 Electrophysiology Office Note:   Date:  02/16/2024  ID:  Jaime Rich, DOB 17-Jan-1966, MRN [id number]  Primary Cardiologist: Redell Shallow, MD Electrophysiologist: [name], MD   Electrophysiologist:  [name], MD      History of Present Illness:   Jaime Rich is a 58 y.o. female with h/o SVT, tobacco use seen today for routine electrophysiology follow-up s/p Ablation.  Patient originally seen by Dr. 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 She was given PRN Metoprolol  Tartrate and referred to EP clinic to discuss ablation. Patient noted recurrent palpitations when she saw Dr. Shallow for follow up a few days later and was switched to daily Toprol  XL. Patient saw Dr. Primus in clinic on 12/07/23 and reported no recurrent severe SVT but had noted HR in the low 100s associated with symptoms. A 2 week Zio monitor was ordered and patient scheduled for SVT ablation. Her monitor showed sinus rhythm with max HR 125bpm, possible AT with variable block.   Since her ablation, the patient reports 2 episodes of recurrent tachycardia. She called the clinic on 11/5 reporting elevated HR in the 130s. This eventually resolved with rest. Patient had a second episode yesterday while driving in her car. Duration was about 12 min with rates into the 150s. Patient took Metoprolol  but believes symptoms resolved before medication took effect. Admits to significant anxiety regarding her palpitations, worries it could cause a heart attack. She has been limiting her physical activity and travel out of concern of recurrent SVT.   Review of systems complete and found to be negative unless listed in HPI.   EP Information / Studies Reviewed:    EKG is ordered today. Personal review as below.  EKG Interpretation Date/Time:  Tuesday February 16 2024 14:08:01 EST Ventricular Rate:  70 PR Interval:  134 QRS Duration:  76 QT Interval:  404 QTC  Calculation: 436 R Axis:   64  Text Interpretation: Normal sinus rhythm Normal ECG When compared with ECG of 13-Jan-2024 10:43, No significant change was found Confirmed by Trudy Birmingham [phone]) on 02/16/2024 2:09:53 PM    Arrhythmia/Device History No specialty comments available.   Physical Exam:   VS:  BP (!) 152/82   Pulse 69   Ht 5' 9 (1.753 m)   Wt 143 lb (64.9 kg)   SpO2 98%   BMI 21.12 kg/m    Wt Readings from Last 3 Encounters:  02/16/24 143 lb (64.9 kg)  01/13/24 139 lb (63 kg)  12/07/23 136 lb 1.6 oz (61.7 kg)     GEN: No acute distress NECK: No JVD; No carotid bruits CARDIAC: Regular rate and rhythm, no murmurs, rubs, gallops RESPIRATORY:  Clear to auscultation without rales, wheezing or rhonchi  ABDOMEN: Soft, non-tender, non-distended EXTREMITIES:  No edema; No deformity   ASSESSMENT AND PLAN:    SVT Palpitations Patient s/p SVT ablation with Dr. Primus on 01/13/24. Since her ablation, has had 2 episodes consistent with SVT, though shorter in duration and with lower rates that prior to her ablation. Patient remains highly symptomatic and has significant anxiety about recurrence. I spoke with Dr. Primus today regarding patient. Given fairly extensive ablation and location of slow pathways, increased risk of needing pacemaker with repeat ablation, recommended AAD first. Discussed Flecainide with patient who is agreeable to starting.  Start Flecainide 50mg  q12 hr. Will arrange 1 week ECG and 4-6 week exercise tolerance test.  Continue Metoprolol  Tartrate 50mg  PRN for breakthrough SVT.  Informed Consent   Shared Decision Making/Informed Consent The risks [chest pain, shortness of breath, cardiac arrhythmias, dizziness, blood pressure fluctuations, myocardial infarction, stroke/transient ischemic attack, and life-threatening complications (estimated to be 1 in 10,000)], benefits (risk stratification, diagnosing coronary artery disease, treatment guidance) and  alternatives of an exercise tolerance test were discussed in detail with Jaime Rich and she agrees to proceed.         Follow up with EP APP in 3 months  Signed, Artist Pouch, PA-C

## 2024-02-16 NOTE — Patient Instructions (Signed)
 Medication Instructions:  Start flecainide 50 mg every 12 hours *If you need a refill on your cardiac medications before your next appointment, please call your pharmacy*  Lab Work: None ordered If you have labs (blood work) drawn today and your tests are completely normal, you will receive your results only by: MyChart Message (if you have MyChart) OR A paper copy in the mail If you have any lab test that is abnormal or we need to change your treatment, we will call you to review the results.  Testing/Procedures: Your physician has requested that you have an Exercise Stress Test in 1 month. An exercise tolerance test is a test to check how your heart works during exercise. You will need to walk on a treadmill for this test. An electrocardiogram (ECG) will record your heartbeat when you are at rest and when you are exercising.    Please arrive 15 minutes prior to your appointment time for registration and insurance purposes.   The test will take approximately 45 minutes to complete.   How to prepare for your Exercise Stress Test:  Do bring a list of your current medications with you.  If not listed below, you may take your medications as normal.  Do wear comfortable clothes (no dresses or overalls) and walking shoes, tennis shoes preferred (no heels or open toed shoes are allowed)  Do Not wear cologne, perfume, aftershave or lotions (deodorant is allowed).  Do not drink or eat foods with caffeine for 24 hours before the test. (Chocolate, coffee, tea, or energy drinks)  If you use an inhaler, bring it with you to the test.  Do not smoke for 4 hours before the test.    If these instructions are not followed, your test will have to be rescheduled.   If you cannot keep your appointment, please provide 24 hours notification to our office, to avoid a possible $50 charge to your account.   Follow-Up: At [facility], you and your health needs are our priority.  As part of our  continuing mission to provide you with exceptional heart care, our providers are all part of one team.  This team includes your primary Cardiologist (physician) and Advanced Practice Providers or APPs (Physician Assistants and Nurse Practitioners) who all work together to provide you with the care you need, when you need it.  Your next appointment:   EKG visit as scheduled  3 month(s)  Provider:   You will see one of the following Advanced Practice Providers on your designated Care Team:   Charlies Arthur, [place] Ozell Jodie Passey, PA-C Daphne Barrack, NP Artist Pouch, PA-C

## 2024-02-22 ENCOUNTER — Telehealth: Payer: Self-pay | Admitting: Student in an Organized Health Care Education/Training Program

## 2024-02-22 NOTE — Progress Notes (Deleted)
 Patient comes for a post flecainide  start EKG for her SVT She is on flecainide  50mg  BID with metoprolol   02/16/24: SR 70bpm, PR , QRS 76ms, QTc  Today's EKG is reviewed: ***  ETT scheduled   ***

## 2024-02-22 NOTE — Telephone Encounter (Signed)
 Patient cancelled 12/09 appointment with Charlies Arthur for EKG only due to the weather. There aren't any openings prior to 1/08 stress test. Can patient be scheduled for a nurse visit? Please advise.

## 2024-02-23 ENCOUNTER — Ambulatory Visit: Admitting: Physician Assistant

## 2024-02-23 NOTE — Telephone Encounter (Signed)
 Please advise

## 2024-02-29 DIAGNOSIS — R000 Tachycardia, unspecified: Secondary | ICD-10-CM

## 2024-03-18 ENCOUNTER — Telehealth (HOSPITAL_COMMUNITY): Payer: Self-pay | Admitting: *Deleted

## 2024-03-18 NOTE — Telephone Encounter (Signed)
 Reminder call with instructions left on voice mail for upcoming GXT on 03/24/24 at 3:45

## 2024-03-24 ENCOUNTER — Ambulatory Visit (HOSPITAL_COMMUNITY)
Admission: RE | Admit: 2024-03-24 | Discharge: 2024-03-24 | Disposition: A | Discharge status: Home / Self Care | Source: Ambulatory Visit | Attending: Cardiovascular Disease | Admitting: Cardiovascular Disease

## 2024-03-24 DIAGNOSIS — Z79899 Other long term (current) drug therapy: Secondary | ICD-10-CM | POA: Insufficient documentation

## 2024-03-24 DIAGNOSIS — Z5181 Encounter for therapeutic drug level monitoring: Secondary | ICD-10-CM | POA: Diagnosis present

## 2024-03-24 LAB — EXERCISE TOLERANCE TEST
Angina Index: 0
Estimated workload: 10.1
Exercise duration (min): 8 min
Exercise duration (sec): 0 s
MPHR: 161 {beats}/min
Peak HR: 139 {beats}/min
Percent HR: 86 %
Rest HR: 75 {beats}/min

## 2024-03-25 ENCOUNTER — Ambulatory Visit: Payer: Self-pay | Admitting: Cardiology

## 2024-05-20 ENCOUNTER — Ambulatory Visit: Admitting: Pulmonary Disease
